# Patient Record
Sex: Female | Born: 1985 | Race: Black or African American | Hispanic: No | Marital: Single | Smoking: Never smoker
Health system: Southern US, Community
[De-identification: ages and names within clinical notes are randomized; demographics above are authoritative.]

## PROBLEM LIST (undated history)

## (undated) ENCOUNTER — Inpatient Hospital Stay: Payer: Self-pay

## (undated) DIAGNOSIS — A6000 Herpesviral infection of urogenital system, unspecified: Secondary | ICD-10-CM

## (undated) DIAGNOSIS — J45909 Unspecified asthma, uncomplicated: Secondary | ICD-10-CM

## (undated) DIAGNOSIS — D649 Anemia, unspecified: Secondary | ICD-10-CM

---
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
## 2006-01-23 ENCOUNTER — Emergency Department: Payer: Self-pay | Admitting: Emergency Medicine

## 2006-02-16 ENCOUNTER — Emergency Department: Payer: Self-pay | Admitting: Emergency Medicine

## 2007-06-24 ENCOUNTER — Emergency Department: Payer: Self-pay | Admitting: Emergency Medicine

## 2007-08-04 ENCOUNTER — Emergency Department: Payer: Self-pay | Admitting: Emergency Medicine

## 2007-10-03 ENCOUNTER — Ambulatory Visit: Payer: Self-pay | Admitting: Family Medicine

## 2007-12-25 ENCOUNTER — Encounter: Payer: Self-pay | Admitting: Maternal & Fetal Medicine

## 2008-01-26 ENCOUNTER — Observation Stay: Payer: Self-pay

## 2008-01-28 ENCOUNTER — Inpatient Hospital Stay: Payer: Self-pay | Admitting: Unknown Physician Specialty

## 2009-09-10 ENCOUNTER — Emergency Department: Payer: Self-pay | Admitting: Emergency Medicine

## 2009-10-30 ENCOUNTER — Emergency Department: Payer: Self-pay | Admitting: Emergency Medicine

## 2009-11-01 ENCOUNTER — Ambulatory Visit: Payer: Self-pay | Admitting: Unknown Physician Specialty

## 2009-11-05 LAB — PATHOLOGY REPORT

## 2011-03-18 ENCOUNTER — Emergency Department: Payer: Self-pay | Admitting: *Deleted

## 2011-03-18 LAB — COMPREHENSIVE METABOLIC PANEL
Albumin: 3.4 g/dL (ref 3.4–5.0)
Alkaline Phosphatase: 56 U/L (ref 50–136)
Anion Gap: 8 (ref 7–16)
BUN: 6 mg/dL — ABNORMAL LOW (ref 7–18)
Bilirubin,Total: 0.3 mg/dL (ref 0.2–1.0)
Calcium, Total: 8.6 mg/dL (ref 8.5–10.1)
Chloride: 103 mmol/L (ref 98–107)
Creatinine: 0.5 mg/dL — ABNORMAL LOW (ref 0.60–1.30)
EGFR (Non-African Amer.): >60
Osmolality: 268 (ref 275–301)
Potassium: 3.6 mmol/L (ref 3.5–5.1)
Sodium: 136 mmol/L (ref 136–145)
Total Protein: 8 g/dL (ref 6.4–8.2)

## 2011-03-18 LAB — CBC
HCT: 28.4 % — ABNORMAL LOW (ref 35.0–47.0)
HGB: 9.2 g/dL — ABNORMAL LOW (ref 12.0–16.0)
MCH: 25.1 pg — ABNORMAL LOW (ref 26.0–34.0)
MCHC: 32.5 g/dL (ref 32.0–36.0)
MCV: 77 fL — ABNORMAL LOW (ref 80–100)
RBC: 3.68 10*6/uL — ABNORMAL LOW (ref 3.80–5.20)
RDW: 18.6 % — ABNORMAL HIGH (ref 11.5–14.5)
WBC: 4.1 10*3/uL (ref 3.6–11.0)

## 2011-03-18 LAB — URINALYSIS, COMPLETE
Glucose,UR: NEGATIVE mg/dL (ref 0–75)
Ketone: NEGATIVE
Nitrite: NEGATIVE
Ph: 7 (ref 4.5–8.0)
Protein: 30
RBC,UR: 3 /HPF (ref 0–5)
Specific Gravity: 1.028 (ref 1.003–1.030)
WBC UR: 23 /HPF (ref 0–5)

## 2011-03-21 LAB — URINE CULTURE

## 2011-07-21 ENCOUNTER — Observation Stay: Payer: Self-pay | Admitting: Obstetrics and Gynecology

## 2011-07-21 LAB — URINALYSIS, COMPLETE
Glucose,UR: NEGATIVE mg/dL (ref 0–75)
Ketone: NEGATIVE
Ph: 8 (ref 4.5–8.0)
Squamous Epithelial: 2

## 2011-09-23 ENCOUNTER — Inpatient Hospital Stay: Payer: Self-pay | Admitting: Obstetrics and Gynecology

## 2011-09-23 ENCOUNTER — Observation Stay: Payer: Self-pay

## 2011-09-23 LAB — CBC WITH DIFFERENTIAL/PLATELET
Eosinophil #: 0 10*3/uL (ref 0.0–0.7)
Eosinophil %: 0.2 %
HCT: 23.9 % — ABNORMAL LOW (ref 35.0–47.0)
HGB: 7.2 g/dL — ABNORMAL LOW (ref 12.0–16.0)
Lymphocyte #: 1.3 10*3/uL (ref 1.0–3.6)
Lymphocyte %: 24.7 %
MCH: 21.9 pg — ABNORMAL LOW (ref 26.0–34.0)
MCV: 73 fL — ABNORMAL LOW (ref 80–100)
Monocyte %: 8 %
Neutrophil #: 3.4 10*3/uL (ref 1.4–6.5)
Platelet: 133 10*3/uL — ABNORMAL LOW (ref 150–440)
RBC: 3.29 10*6/uL — ABNORMAL LOW (ref 3.80–5.20)
WBC: 5.1 10*3/uL (ref 3.6–11.0)

## 2011-09-27 LAB — PATHOLOGY REPORT

## 2013-03-21 ENCOUNTER — Emergency Department: Payer: Self-pay | Admitting: Internal Medicine

## 2014-07-16 NOTE — H&P (Signed)
L&D Evaluation:  History Expanded:   HPI 29 yo [id number] with EDD of 10/04/11 presents with c/o abdominal pain after intercourse earlier this evening. Denies LOF or VB. PNC started and ACHD and tx to [facility]. Had pap with LGSIL, colp has not been done yet. H/o HSV.    Blood Type A positive    Group B Strep Results (Result >5wks must be treated as unknown) unknown/result > 5 weeks ago    Maternal HIV Negative    Maternal Syphilis Ab Nonreactive    Maternal Varicella Immune    Rubella Results immune    Maternal T-Dap Nonimmune    Patient's Medical History No Chronic Illness  SVD x 2, EAB x1, SAB x 1    Patient's Surgical History none    Medications Pre Natal Vitamins    Allergies NKDA    Social History none   ROS:   ROS see HPI   Exam:   Vital Signs stable    General no apparent distress    Mental Status clear    Chest clear    Heart no murmur/gallop/rubs    Abdomen gravid, mild contractions palpated    Fetal Position vertex    Edema no edema    Pelvic no external lesions    Mebranes Intact    FHT normal rate with no decels, appropriate for gestational age    Ucx mild, irregular   Impression:   Impression Preterm contractions after intercourse this evening   Plan:   Plan UA    Comments UA showed 3+ Leuk, 29 WBC, 2+ blood, trace bacteria, 2 epithelial. Given Rx for Macrobid, will order urine c&s. After resting and po hydration x approx 1 hour, pt reports pain has decreased. D/c home with instructions to abstain from intercourse x 2-3 weeks. Recommended to maintain hydration.    Follow Up Appointment already scheduled. 5/15   Electronic Signatures: Ander Purpura (CNM)  (Signed 15-May-13 03:40)  Authored: L&D Evaluation   Last Updated: 15-May-13 03:40 by Ander Purpura (CNM)

## 2014-07-16 NOTE — H&P (Signed)
L&D Evaluation:  History Expanded:   HPI 29 yo [id number] with EDD of 10/04/11 presents at 38 3/7 weeks with c/o contractions, dilated 6 cm per RN. She was evaluated for the same this am and d/c home dilated 4 cm. Denies LOF or VB. PNC started and ACHD and tx to [facility]. Had pap with LGSIL. H/o HSV - no outbreaks this pregnancy.    Blood Type (Maternal) A positive    Group B Strep Results Maternal (Result >5wks must be treated as unknown) negative    Maternal HIV Negative    Maternal Syphilis Ab Nonreactive    Maternal Varicella Immune    Rubella Results (Maternal) immune    Maternal T-Dap Nonimmune    Patient's Medical History No Chronic Illness  SVD x 2, EAB x1, SAB x 1    Patient's Surgical History none    Medications Pre Natal Vitamins    Allergies NKDA    Social History none   ROS:   ROS see HPI   Exam:   Vital Signs stable    General no apparent distress    Mental Status clear    Abdomen gravid, non-tender    Estimated Fetal Weight Average for gestational age    Fetal Position vertex    Edema no edema    Pelvic no external lesions, 7/80/-1, posterior    Mebranes Intact    FHT normal rate with no decels, Category 1    Ucx q 2-5 min   Impression:   Impression active labor   Plan:   Comments AROM performed with light meconium-stained amniotic fluid obtained. May have IV analgesia prn.  Declines epidural Anticipate vaginal delivery.   Electronic Signatures: Ander Purpura (CNM)  (Signed 18-Jul-13 21:52)  Authored: L&D Evaluation   Last Updated: 18-Jul-13 21:52 by Ander Purpura (CNM)

## 2014-07-16 NOTE — H&P (Signed)
L&D Evaluation:  History Expanded:   HPI 29 yo [id number] with EDD of 10/04/11 presents at 38 3/7 weeks with c/o contractions this am. Pt was dilated 4 cm yesterday in the office. Denies LOF or VB. PNC started and ACHD and tx to [facility]. Had pap with LGSIL. H/o HSV.    Blood Type (Maternal) A positive    Group B Strep Results Maternal (Result >5wks must be treated as unknown) negative  unknown/result > 5 weeks ago    Maternal HIV Negative    Maternal Syphilis Ab Nonreactive    Maternal Varicella Immune    Rubella Results (Maternal) immune    Maternal T-Dap Nonimmune    Patient's Medical History No Chronic Illness  SVD x 2, EAB x1, SAB x 1    Patient's Surgical History none    Medications Pre Natal Vitamins    Allergies NKDA    Social History none   ROS:   ROS see HPI   Exam:   Vital Signs stable    General no apparent distress    Mental Status clear    Abdomen gravid, non-tender    Estimated Fetal Weight Average for gestational age    Fetal Position vertex    Edema no edema    Pelvic no external lesions, 4-5/80/-2, posterior    Mebranes Intact    FHT normal rate with no decels, Category 1    Ucx q 8-10 min   Impression:   Impression early labor   Plan:   Plan discharge    Comments Labor precautions    Follow Up Appointment already scheduled. 7/24   Electronic Signatures: Burlene Arnt K (CNM)  (Signed 18-Jul-13 12:57)  Authored: L&D Evaluation   Last Updated: 18-Jul-13 12:57 by Ander Purpura (CNM)

## 2015-02-21 ENCOUNTER — Emergency Department
Admission: EM | Admit: 2015-02-21 | Discharge: 2015-02-21 | Disposition: A | Discharge status: Home / Self Care | Payer: Self-pay | Attending: Emergency Medicine | Admitting: Emergency Medicine

## 2015-02-21 ENCOUNTER — Encounter: Payer: Self-pay | Admitting: Emergency Medicine

## 2015-02-21 DIAGNOSIS — Z3A00 Weeks of gestation of pregnancy not specified: Secondary | ICD-10-CM | POA: Insufficient documentation

## 2015-02-21 DIAGNOSIS — Z3490 Encounter for supervision of normal pregnancy, unspecified, unspecified trimester: Secondary | ICD-10-CM

## 2015-02-21 DIAGNOSIS — O99019 Anemia complicating pregnancy, unspecified trimester: Secondary | ICD-10-CM | POA: Insufficient documentation

## 2015-02-21 DIAGNOSIS — E876 Hypokalemia: Secondary | ICD-10-CM

## 2015-02-21 DIAGNOSIS — R42 Dizziness and giddiness: Secondary | ICD-10-CM | POA: Insufficient documentation

## 2015-02-21 DIAGNOSIS — D649 Anemia, unspecified: Secondary | ICD-10-CM

## 2015-02-21 DIAGNOSIS — O99280 Endocrine, nutritional and metabolic diseases complicating pregnancy, unspecified trimester: Secondary | ICD-10-CM | POA: Insufficient documentation

## 2015-02-21 DIAGNOSIS — R51 Headache: Secondary | ICD-10-CM | POA: Insufficient documentation

## 2015-02-21 LAB — URINALYSIS COMPLETE WITH MICROSCOPIC (ARMC ONLY)
BILIRUBIN URINE: NEGATIVE
Glucose, UA: NEGATIVE mg/dL
HGB URINE DIPSTICK: NEGATIVE
Ketones, ur: NEGATIVE mg/dL
NITRITE: NEGATIVE
PH: 5 (ref 5.0–8.0)
Protein, ur: NEGATIVE mg/dL
SPECIFIC GRAVITY, URINE: 1.026 (ref 1.005–1.030)

## 2015-02-21 LAB — CBC WITH DIFFERENTIAL/PLATELET
Basophils Absolute: 0 10*3/uL (ref 0–0.1)
Basophils Relative: 1 %
Eosinophils Absolute: 0 10*3/uL (ref 0–0.7)
Eosinophils Relative: 1 %
HEMATOCRIT: 32.1 % — AB (ref 35.0–47.0)
HEMOGLOBIN: 10.7 g/dL — AB (ref 12.0–16.0)
LYMPHS PCT: 43 %
Lymphs Abs: 1.9 10*3/uL (ref 1.0–3.6)
MCH: 27.8 pg (ref 26.0–34.0)
MCHC: 33.2 g/dL (ref 32.0–36.0)
MCV: 83.8 fL (ref 80.0–100.0)
MONOS PCT: 6 %
Monocytes Absolute: 0.3 10*3/uL (ref 0.2–0.9)
NEUTROS ABS: 2.2 10*3/uL (ref 1.4–6.5)
NEUTROS PCT: 49 %
Platelets: 194 10*3/uL (ref 150–440)
RBC: 3.83 MIL/uL (ref 3.80–5.20)
RDW: 15.9 % — ABNORMAL HIGH (ref 11.5–14.5)
WBC: 4.4 10*3/uL (ref 3.6–11.0)

## 2015-02-21 LAB — BASIC METABOLIC PANEL
ANION GAP: 6 (ref 5–15)
BUN: 5 mg/dL — ABNORMAL LOW (ref 6–20)
CHLORIDE: 104 mmol/L (ref 101–111)
CO2: 26 mmol/L (ref 22–32)
Calcium: 8.7 mg/dL — ABNORMAL LOW (ref 8.9–10.3)
Creatinine, Ser: 0.58 mg/dL (ref 0.44–1.00)
GFR calc non Af Amer: >60 mL/min (ref >60)
Glucose, Bld: 94 mg/dL (ref 65–99)
Potassium: 2.9 mmol/L — CL (ref 3.5–5.1)
Sodium: 136 mmol/L (ref 135–145)

## 2015-02-21 LAB — POCT PREGNANCY, URINE: Preg Test, Ur: POSITIVE — AB

## 2015-02-21 MED ORDER — POTASSIUM CHLORIDE CRYS ER 20 MEQ PO TBCR
EXTENDED_RELEASE_TABLET | ORAL | Status: AC
  Filled 2015-02-21: qty 1

## 2015-02-21 MED ORDER — POTASSIUM CHLORIDE CRYS ER 20 MEQ PO TBCR
40.0000 meq | EXTENDED_RELEASE_TABLET | Freq: Once | ORAL | Status: DC
  Filled 2015-02-21: qty 2

## 2015-02-21 MED ORDER — ACETAMINOPHEN-CODEINE #3 300-30 MG PO TABS: 1.0000 | ORAL_TABLET | Freq: Once | ORAL | Status: DC

## 2015-02-21 MED ORDER — POTASSIUM CHLORIDE 20 MEQ PO PACK: 20.0000 meq | PACK | Freq: Once | ORAL | Status: DC

## 2015-02-21 MED ORDER — POTASSIUM CHLORIDE 20 MEQ PO PACK
40.0000 meq | PACK | Freq: Two times a day (BID) | ORAL | Status: DC
  Administered 2015-02-21: 40 meq via ORAL
  Filled 2015-02-21: qty 2

## 2015-02-21 NOTE — Discharge Instructions (Signed)
Anemia, Nonspecific Anemia is a condition in which the concentration of red blood cells or hemoglobin in the blood is below normal. Hemoglobin is a substance in red blood cells that carries oxygen to the tissues of the body. Anemia results in not enough oxygen reaching these tissues.  CAUSES  Common causes of anemia include:   Excessive bleeding. Bleeding may be internal or external. This includes excessive bleeding from periods (in women) or from the intestine.   Poor nutrition.   Chronic kidney, thyroid, and liver disease.  Bone marrow disorders that decrease red blood cell production.  Cancer and treatments for cancer.  HIV, AIDS, and their treatments.  Spleen problems that increase red blood cell destruction.  Blood disorders.  Excess destruction of red blood cells due to infection, medicines, and autoimmune disorders. SIGNS AND SYMPTOMS   Minor weakness.   Dizziness.   Headache.  Palpitations.   Shortness of breath, especially with exercise.   Paleness.  Cold sensitivity.  Indigestion.  Nausea.  Difficulty sleeping.  Difficulty concentrating. Symptoms may occur suddenly or they may develop slowly.  DIAGNOSIS  Additional blood tests are often needed. These help your health care provider determine the best treatment. Your health care provider will check your stool for blood and look for other causes of blood loss.  TREATMENT  Treatment varies depending on the cause of the anemia. Treatment can include:   Supplements of iron, vitamin 123456, or folic acid.   Hormone medicines.   A blood transfusion. This may be needed if blood loss is severe.   Hospitalization. This may be needed if there is significant continual blood loss.   Dietary changes.  Spleen removal. HOME CARE INSTRUCTIONS Keep all follow-up appointments. It often takes many weeks to correct anemia, and having your health care provider check on your condition and your response to  treatment is very important. SEEK IMMEDIATE MEDICAL CARE IF:   You develop extreme weakness, shortness of breath, or chest pain.   You become dizzy or have trouble concentrating.  You develop heavy vaginal bleeding.   You develop a rash.   You have bloody or black, tarry stools.   You faint.   You vomit up blood.   You vomit repeatedly.   You have abdominal pain.  You have a fever or persistent symptoms for more than 2-3 days.   You have a fever and your symptoms suddenly get worse.   You are dehydrated.  MAKE SURE YOU:  Understand these instructions.  Will watch your condition.  Will get help right away if you are not doing well or get worse.   This information is not intended to replace advice given to you by your health care provider. Make sure you discuss any questions you have with your health care provider.   Document Released: 04/01/2004 Document Revised: 10/25/2012 Document Reviewed: 08/18/2012 Elsevier Interactive Patient Education 2016 [place] will need to follow-up with [facility] clinic about your low potassium level. Also obtain iron tablets over-the-counter or prenatal vitamins with iron to begin taking once daily for your anemia. Take your potassium medication every day starting on Saturday Get an appointment with the OB/GYN of your choice or go to the Health Department Return to the emergency room if any urgent concerns are of sudden worsening of your symptoms.

## 2015-02-21 NOTE — ED Notes (Signed)
poc urine-positive.

## 2015-02-21 NOTE — ED Notes (Signed)
Pt here with c/o dizziness like the "room is spinning" and headache for almost a month now. Pt appears in no distress.

## 2015-02-21 NOTE — ED Notes (Signed)
States she could be pregnant.

## 2015-02-21 NOTE — ED Notes (Signed)
Has had symptoms for a month

## 2015-02-21 NOTE — ED Provider Notes (Signed)
-----------------------------------------   7:28 PM on 02/21/2015 -----------------------------------------  I, Nance Pear, attending physician, personally viewed and interpreted this EKG  EKG Time: 1811 Rate: 79 Rhythm: NSR Axis: normal Intervals: qtc 444 QRS: narrow ST changes: no st elevation Impression: normal ekg   Nance Pear, MD 02/21/15 1931

## 2015-02-21 NOTE — ED Provider Notes (Signed)
[facility] Emergency Department Provider Note  ____________________________________________  Time seen: Approximately 5:13 PM  I have reviewed the triage vital signs and the nursing notes.   HISTORY  Chief Complaint Headache and Dizziness   HPI Lindsey Cain is a 29 y.o. female with complaint of dizziness and headache for approximately 1 month. Patient states she has not seen her primary care doctor for this. She also states that there is possibility she could be pregnant. She denies any vaginal bleeding or vaginal pain. She has not experienced any abdominal discomfort at all. Patient denies any upper respiratory symptoms, fever or chills, she is not having nausea or vomiting. She denies any problems with her vision. She states she has had not had any problems walking or feeling faint. Patient states she does have a history of anemia.   History reviewed. No pertinent past medical history.  There are no active problems to display for this patient.   History reviewed. No pertinent past surgical history.  Current Outpatient Rx  Name  Route  Sig  Dispense  Refill  . potassium chloride (KLOR-CON) 20 MEQ packet   Oral   Take 20 mEq by mouth once.   7 packet   0     Allergies Review of patient's allergies indicates no known allergies.  No family history on file.  Social History Social History  Substance Use Topics  . Smoking status: Never Smoker   . Smokeless tobacco: None  . Alcohol Use: No    Review of Systems Constitutional: No fever/chills Eyes: No visual changes. ENT: No sore throat. Cardiovascular: Denies chest pain. Respiratory: Denies shortness of breath. Gastrointestinal: No abdominal pain.  No nausea, no vomiting.  No diarrhea.  Genitourinary: Negative for dysuria. No vaginal discharge. Musculoskeletal: Negative for back pain. Skin: Negative for rash. Neurological: Negative for headaches, positive dizziness  10-point ROS  otherwise negative.  ____________________________________________   PHYSICAL EXAM:  VITAL SIGNS: ED Triage Vitals  Enc Vitals Group     BP --      Pulse --      Resp --      Temp --      Temp src --      SpO2 --      Weight 02/21/15 1649 110 lb (49.896 kg)     Height 02/21/15 1649 5\' 7"  (1.702 m)     Head Cir --      Peak Flow --      Pain Score 02/21/15 1650 3     Pain Loc --      Pain Edu? --      Excl. in [place]? --     Constitutional: Alert and oriented. Well appearing and in no acute distress. Eyes: Conjunctivae are normal. PERRL. EOMI. no nystagmus is noted Head: Atraumatic. Nose: No congestion/rhinnorhea. Mouth/Throat: Mucous membranes are moist.  Oropharynx non-erythematous. Neck: No stridor.  Supple Hematological/Lymphatic/Immunilogical: No cervical lymphadenopathy. Cardiovascular: Normal rate, regular rhythm. Grossly normal heart sounds.  Good peripheral circulation. Respiratory: Normal respiratory effort.  No retractions. Lungs CTAB. Gastrointestinal: Soft and nontender. No distention.  No CVA tenderness. Musculoskeletal: There is all extremities without any difficulty. Patient was noted to have normal gait while in the emergency room. Neurologic:  Normal speech and language. No gross focal neurologic deficits are appreciated. No gait instability. Skin:  Skin is warm, dry and intact. No rash noted. Psychiatric: Mood and affect are normal. Speech and behavior are normal.  ____________________________________________   LABS (all labs ordered are  listed, but only abnormal results are displayed)  Labs Reviewed  BASIC METABOLIC PANEL - Abnormal; Notable for the following:    Potassium 2.9 (*)    BUN 5 (*)    Calcium 8.7 (*)    All other components within normal limits  CBC WITH DIFFERENTIAL/PLATELET - Abnormal; Notable for the following:    Hemoglobin 10.7 (*)    HCT 32.1 (*)    RDW 15.9 (*)    All other components within normal limits  URINALYSIS  COMPLETEWITH MICROSCOPIC ([facility] ONLY) - Abnormal; Notable for the following:    Color, Urine YELLOW (*)    APPearance CLEAR (*)    Leukocytes, UA TRACE (*)    Bacteria, UA RARE (*)    Squamous Epithelial / LPF 0-5 (*)    All other components within normal limits  POCT PREGNANCY, URINE - Abnormal; Notable for the following:    Preg Test, Ur POSITIVE (*)    All other components within normal limits  POC URINE PREG, ED   ____________________________________________  EKG  Per Dr. Archie Balboa ____________________________________________  RADIOLOGY  Not done ____________________________________________   PROCEDURES  Procedure(s) performed: None  Critical Care performed: No  ____________________________________________   INITIAL IMPRESSION / ASSESSMENT AND PLAN / ED COURSE  Pertinent labs & imaging results that were available during my care of the patient were reviewed by me and considered in my medical decision making (see chart for details).  Patient potassium was reported as 2.9 orally in the emergency room. Patient was able to drink potassium but unable to swallow potassium tablets. She also was anemic but has a history of same. Patient was discharged with a prescription for potassium packets along with instructions to obtain prenatal vitamins with iron. Patient states that she prefers to go to [place] side and was given instructions to do so. She is also to follow-up with [facility] clinic about her potassium. Patient was made aware that her pregnancy test is positive. She will call [facility] for an appointment. Patient is instructed to return to the emergency room if any severe worsening of her symptoms, abdominal pain, or vaginal bleeding. ____________________________________________   FINAL CLINICAL IMPRESSION(S) / ED DIAGNOSES  Final diagnoses:  Acute hypokalemia  Anemia, unspecified anemia type  Pregnancy      Johnn Hai, PA-C 02/21/15 2155  Nance Pear,  MD 02/21/15 [phone]

## 2015-03-09 NOTE — L&D Delivery Note (Signed)
Obstetrical Delivery Note   Date of Delivery:   10/08/2015 Primary OB:   [facility] Gestational Age/EDD: [redacted]w[redacted]d (Dated by LMP) Antepartum complications: anemia and late entry to care. 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 Apgars 8/9. Weight pending. Baby placed on mother's abdomen until cord stopped pulsating. The cord was then clamped and cut by mother. Baby placed skin to skin with mother. Anesthesia:    epidural Intrapartum complications: None GBS:    negative Laceration:    none Episiotomy:    none Placenta:    Via active 3rd stage. To pathology: no Estimated Blood Loss:  350  Baby:    Liveborn female, Apgars 8/9, weight pending.     Dalia Heading, CNM

## 2015-03-19 ENCOUNTER — Emergency Department
Admission: EM | Admit: 2015-03-19 | Discharge: 2015-03-19 | Disposition: A | Discharge status: Home / Self Care | Payer: Self-pay | Attending: Emergency Medicine | Admitting: Emergency Medicine

## 2015-03-19 ENCOUNTER — Emergency Department: Payer: Self-pay

## 2015-03-19 DIAGNOSIS — Z79899 Other long term (current) drug therapy: Secondary | ICD-10-CM | POA: Insufficient documentation

## 2015-03-19 DIAGNOSIS — Z3A09 9 weeks gestation of pregnancy: Secondary | ICD-10-CM | POA: Insufficient documentation

## 2015-03-19 DIAGNOSIS — O200 Threatened abortion: Secondary | ICD-10-CM | POA: Insufficient documentation

## 2015-03-19 LAB — CBC
HCT: 35.3 % (ref 35.0–47.0)
HEMOGLOBIN: 11.6 g/dL — AB (ref 12.0–16.0)
MCH: 27.4 pg (ref 26.0–34.0)
MCHC: 32.9 g/dL (ref 32.0–36.0)
MCV: 83.4 fL (ref 80.0–100.0)
Platelets: 213 10*3/uL (ref 150–440)
RBC: 4.23 MIL/uL (ref 3.80–5.20)
RDW: 15.7 % — ABNORMAL HIGH (ref 11.5–14.5)
WBC: 4.1 10*3/uL (ref 3.6–11.0)

## 2015-03-19 LAB — ABO/RH: ABO/RH(D): A POS

## 2015-03-19 LAB — HCG, QUANTITATIVE, PREGNANCY: HCG, BETA CHAIN, QUANT, S: 240988 m[IU]/mL — AB (ref <5)

## 2015-03-19 NOTE — ED Notes (Signed)
Patient transported to Ultrasound 

## 2015-03-19 NOTE — Discharge Instructions (Signed)
Please call the number provided for the health department, or an OB/GYN to arrange a follow-up appointment in 2 days. Recheck of your labs. Today's beta hCG (pregnancy hormone level) is 240,000. Return to the emergency department for any lower abdominal pain, increased bleeding, or any other symptom personally concerning to yourself.    Threatened Miscarriage A threatened miscarriage occurs when you have vaginal bleeding during your first 20 weeks of pregnancy but the pregnancy has not ended. If you have vaginal bleeding during this time, your health care provider will do tests to make sure you are still pregnant. If the tests show you are still pregnant and the developing baby (fetus) inside your womb (uterus) is still growing, your condition is considered a threatened miscarriage. A threatened miscarriage does not mean your pregnancy will end, but it does increase the risk of losing your pregnancy (complete miscarriage). CAUSES  The cause of a threatened miscarriage is usually not known. If you go on to have a complete miscarriage, the most common cause is an abnormal number of chromosomes in the developing baby. Chromosomes are the structures inside cells that hold all your genetic material. Some causes of vaginal bleeding that do not result in miscarriage include:  Having sex.  Having an infection.  Normal hormone changes of pregnancy.  Bleeding that occurs when an egg implants in your uterus. RISK FACTORS Risk factors for bleeding in early pregnancy include:  Obesity.  Smoking.  Drinking excessive amounts of alcohol or caffeine.  Recreational drug use. SIGNS AND SYMPTOMS  Light vaginal bleeding.  Mild abdominal pain or cramps. DIAGNOSIS  If you have bleeding with or without abdominal pain before 20 weeks of pregnancy, your health care provider will do tests to check whether you are still pregnant. One important test involves using sound waves and a computer (ultrasound) to  create images of the inside of your uterus. Other tests include an internal exam of your vagina and uterus (pelvic exam) and measurement of your baby's heart rate.  You may be diagnosed with a threatened miscarriage if:  Ultrasound testing shows you are still pregnant.  Your baby's heart rate is strong.  A pelvic exam shows that the opening between your uterus and your vagina (cervix) is closed.  Your heart rate and blood pressure are stable.  Blood tests confirm you are still pregnant. TREATMENT  No treatments have been shown to prevent a threatened miscarriage from going on to a complete miscarriage. However, the right home care is important.  HOME CARE INSTRUCTIONS   Make sure you keep all your appointments for prenatal care. This is very important.  Get plenty of rest.  Do not have sex or use tampons if you have vaginal bleeding.  Do not douche.  Do not smoke or use recreational drugs.  Do not drink alcohol.  Avoid caffeine. SEEK MEDICAL CARE IF:  You have light vaginal bleeding or spotting while pregnant.  You have abdominal pain or cramping.  You have a fever. SEEK IMMEDIATE MEDICAL CARE IF:  You have heavy vaginal bleeding.  You have blood clots coming from your vagina.  You have severe low back pain or abdominal cramps.  You have fever, chills, and severe abdominal pain. MAKE SURE YOU:  Understand these instructions.  Will watch your condition.  Will get help right away if you are not doing well or get worse.   This information is not intended to replace advice given to you by your health care provider. Make sure you discuss  any questions you have with your health care provider.   Document Released: 02/22/2005 Document Revised: 02/27/2013 Document Reviewed: 12/19/2012 Elsevier Interactive Patient Education [employer].

## 2015-03-19 NOTE — ED Provider Notes (Signed)
[facility] Emergency Department Provider Note  Time seen: 8:17 AM  I have reviewed the triage vital signs and the nursing notes.   HISTORY  Chief Complaint Vaginal Bleeding    HPI Lindsey Cain is a 30 y.o. female G5 P3 A1 who presents to the emergency department with lower abdominal cramping and vaginal bleeding. According to the patient last night she began experiencing lower abdominal cramping she states was mild. States the cramping is largely resolved at this morning while wiping she noticed a small amount of blood on the toilet paper. Patient denies any dysuria. States her abdominal pain is mild, and improved from what it was yesterday. Patient states her last menstrual period Was 01/16/15. Approximately [redacted] weeks pregnant this point.    No past medical history on file.  There are no active problems to display for this patient.   No past surgical history on file.  Current Outpatient Rx  Name  Route  Sig  Dispense  Refill  . potassium chloride (KLOR-CON) 20 MEQ packet   Oral   Take 20 mEq by mouth once.   7 packet   0     Allergies Review of patient's allergies indicates no known allergies.  No family history on file.  Social History Social History  Substance Use Topics  . Smoking status: Never Smoker   . Smokeless tobacco: Not on file  . Alcohol Use: No    Review of Systems Constitutional: Negative for fever Cardiovascular: Negative for chest pain. Respiratory: Negative for shortness of breath. Gastrointestinal: Mild lower abdominal cramping. Genitourinary: Vaginal spotting. Musculoskeletal: Negative for back pain 10-point ROS otherwise negative.  ____________________________________________   PHYSICAL EXAM:  VITAL SIGNS: ED Triage Vitals  Enc Vitals Group     BP 03/19/15 0811 119/83 mmHg     Pulse Rate 03/19/15 0811 90     Resp 03/19/15 0811 16     Temp 03/19/15 0811 98.2 F (36.8 C)     Temp Source 03/19/15 0811  Oral     SpO2 03/19/15 0811 100 %     Weight 03/19/15 0811 115 lb (52.164 kg)     Height 03/19/15 0811 5\' 7"  (1.702 m)     Head Cir --      Peak Flow --      Pain Score 03/19/15 0804 2     Pain Loc --      Pain Edu? --      Excl. in [place]? --     Constitutional: Alert and oriented. Well appearing and in no distress. Eyes: Normal exam ENT   Head: Normocephalic and atraumatic.   Mouth/Throat: Mucous membranes are moist. Cardiovascular: Normal rate, regular rhythm. No murmur Respiratory: Normal respiratory effort without tachypnea nor retractions. Breath sounds are clear and equal bilaterally. No wheezes/rales/rhonchi. Gastrointestinal: Soft, mild suprapubic tenderness palpation. No rebound or guarding. No distention. Musculoskeletal: Nontender with normal range of motion in all extremities.  Neurologic:  Normal speech and language. No gross focal neurologic deficits  Skin:  Skin is warm, dry and intact.  Psychiatric: Mood and affect are normal. Speech and behavior are normal  ____________________________________________     RADIOLOGY  Ultrasound consistent with single IUP 9 weeks 2 days.  ____________________________________________    INITIAL IMPRESSION / ASSESSMENT AND PLAN / ED COURSE  Pertinent labs & imaging results that were available during my care of the patient were reviewed by me and considered in my medical decision making (see chart for details).  Patient presents  the emergency department with lower abdominal cramping last night, vaginal spotting today approximately [redacted] weeks pregnant. We will check labs, and obtain an ultrasound to further evaluate. The patient has not yet called an OB/GYN for follow-up.  Ultrasound consistent with IUP at 9 weeks with a normal heart rate. Labs are within normal limits. Positive blood type. We'll discharge home with OB/GYN follow-up. Patient agreeable to plan  ____________________________________________   FINAL CLINICAL  IMPRESSION(S) / ED DIAGNOSES  Threatened abortion   Harvest Dark, MD 03/19/15 1123

## 2015-03-19 NOTE — ED Notes (Signed)
Pt states had abdominal cramping last night and this morning. Pt states this morning she noticed some vaginal spotting. Pt states LMP was 01/16/15.

## 2015-03-19 NOTE — ED Notes (Signed)
Pt reports is about [redacted] weeks pregnant and last pm started with vaginal bleeding and cramping. Has not had an OB visit yet.

## 2015-04-24 DIAGNOSIS — Z8742 Personal history of other diseases of the female genital tract: Secondary | ICD-10-CM | POA: Insufficient documentation

## 2015-04-25 LAB — OB RESULTS CONSOLE VARICELLA ZOSTER ANTIBODY, IGG: VARICELLA IGG: IMMUNE

## 2015-04-25 LAB — OB RESULTS CONSOLE RPR: RPR: NONREACTIVE

## 2015-04-25 LAB — OB RESULTS CONSOLE HEPATITIS B SURFACE ANTIGEN: Hepatitis B Surface Ag: NEGATIVE

## 2015-04-25 LAB — OB RESULTS CONSOLE RUBELLA ANTIBODY, IGM: RUBELLA: IMMUNE

## 2015-05-22 ENCOUNTER — Other Ambulatory Visit: Payer: Self-pay | Admitting: Physician Assistant

## 2015-05-22 DIAGNOSIS — R1310 Dysphagia, unspecified: Secondary | ICD-10-CM | POA: Insufficient documentation

## 2015-05-22 DIAGNOSIS — Z3482 Encounter for supervision of other normal pregnancy, second trimester: Secondary | ICD-10-CM

## 2015-05-27 ENCOUNTER — Emergency Department
Admission: EM | Admit: 2015-05-27 | Discharge: 2015-05-27 | Disposition: A | Discharge status: Home / Self Care | Payer: Self-pay | Attending: Emergency Medicine | Admitting: Emergency Medicine

## 2015-05-27 ENCOUNTER — Encounter: Payer: Self-pay | Admitting: Emergency Medicine

## 2015-05-27 DIAGNOSIS — O26892 Other specified pregnancy related conditions, second trimester: Secondary | ICD-10-CM | POA: Insufficient documentation

## 2015-05-27 DIAGNOSIS — K529 Noninfective gastroenteritis and colitis, unspecified: Secondary | ICD-10-CM | POA: Insufficient documentation

## 2015-05-27 DIAGNOSIS — Z3492 Encounter for supervision of normal pregnancy, unspecified, second trimester: Secondary | ICD-10-CM

## 2015-05-27 DIAGNOSIS — R1030 Lower abdominal pain, unspecified: Secondary | ICD-10-CM

## 2015-05-27 LAB — CBC
HEMATOCRIT: 31 % — AB (ref 35.0–47.0)
Hemoglobin: 10.5 g/dL — ABNORMAL LOW (ref 12.0–16.0)
MCH: 28.4 pg (ref 26.0–34.0)
MCHC: 33.9 g/dL (ref 32.0–36.0)
MCV: 83.7 fL (ref 80.0–100.0)
PLATELETS: 175 10*3/uL (ref 150–440)
RBC: 3.7 MIL/uL — ABNORMAL LOW (ref 3.80–5.20)
RDW: 14.8 % — AB (ref 11.5–14.5)
WBC: 4.6 10*3/uL (ref 3.6–11.0)

## 2015-05-27 LAB — URINALYSIS COMPLETE WITH MICROSCOPIC (ARMC ONLY)
BACTERIA UA: NONE SEEN
Bilirubin Urine: NEGATIVE
GLUCOSE, UA: NEGATIVE mg/dL
Hgb urine dipstick: NEGATIVE
Ketones, ur: NEGATIVE mg/dL
Leukocytes, UA: NEGATIVE
Nitrite: NEGATIVE
Protein, ur: NEGATIVE mg/dL
SPECIFIC GRAVITY, URINE: 1.026 (ref 1.005–1.030)
pH: 5 (ref 5.0–8.0)

## 2015-05-27 LAB — COMPREHENSIVE METABOLIC PANEL
ALBUMIN: 3 g/dL — AB (ref 3.5–5.0)
ALT: 10 U/L — AB (ref 14–54)
AST: 24 U/L (ref 15–41)
Alkaline Phosphatase: 53 U/L (ref 38–126)
Anion gap: 7 (ref 5–15)
BILIRUBIN TOTAL: 0.9 mg/dL (ref 0.3–1.2)
BUN: 8 mg/dL (ref 6–20)
CO2: 23 mmol/L (ref 22–32)
CREATININE: 0.47 mg/dL (ref 0.44–1.00)
Calcium: 8.5 mg/dL — ABNORMAL LOW (ref 8.9–10.3)
Chloride: 102 mmol/L (ref 101–111)
GFR calc Af Amer: >60 mL/min (ref >60)
GFR calc non Af Amer: >60 mL/min (ref >60)
Glucose, Bld: 98 mg/dL (ref 65–99)
POTASSIUM: 3.6 mmol/L (ref 3.5–5.1)
Sodium: 132 mmol/L — ABNORMAL LOW (ref 135–145)
TOTAL PROTEIN: 7.3 g/dL (ref 6.5–8.1)

## 2015-05-27 LAB — HCG, QUANTITATIVE, PREGNANCY: HCG, BETA CHAIN, QUANT, S: 47473 m[IU]/mL — AB (ref <5)

## 2015-05-27 LAB — LIPASE, BLOOD: Lipase: 18 U/L (ref 11–51)

## 2015-05-27 MED ORDER — ACETAMINOPHEN 325 MG PO TABS
650.0000 mg | ORAL_TABLET | Freq: Once | ORAL | Status: AC
  Administered 2015-05-27: 650 mg via ORAL
  Filled 2015-05-27: qty 2

## 2015-05-27 MED ORDER — METOCLOPRAMIDE HCL 10 MG PO TABS: 10.0000 mg | ORAL_TABLET | Freq: Four times a day (QID) | ORAL | Status: DC | PRN

## 2015-05-27 MED ORDER — PRENATAL VITAMINS 0.8 MG PO TABS: 1.0000 | ORAL_TABLET | Freq: Every day | ORAL | Status: DC

## 2015-05-27 NOTE — ED Notes (Addendum)
Pt to ed with c/o abd pain, vomiting and diarrhea that started last night.  [redacted] weeks pregnant, G4P3

## 2015-05-27 NOTE — ED Provider Notes (Signed)
[facility] Emergency Department Provider Note  ____________________________________________  Time seen: 11:50 PM  I have reviewed the triage vital signs and the nursing notes.   HISTORY  Chief Complaint Abdominal Pain    HPI Lindsey Cain is a 30 y.o. female who complains of lower abdominal pain with vomiting and diarrhea that started last night. She is [redacted] weeks pregnant accompanied pregnancy, following up with the health department. No medical history or previous surgeries. No hematemesis, no bloody stool. No pelvic cramping or vaginal bleeding or leakage of fluid. Normal fetal movements. No dizziness or syncope   Pain is moderate intensity, nonradiating, no aggravating or alleviating factors. Constant with waxing and waning course. Achy.  History reviewed. No pertinent past medical history.   There are no active problems to display for this patient.    History reviewed. No pertinent past surgical history.   Current Outpatient Rx  Name  Route  Sig  Dispense  Refill  . metoCLOPramide (REGLAN) 10 MG tablet   Oral   Take 1 tablet (10 mg total) by mouth every 6 (six) hours as needed for nausea.   60 tablet   0   . potassium chloride (KLOR-CON) 20 MEQ packet   Oral   Take 20 mEq by mouth once.   7 packet   0   . Prenatal Multivit-Min-Fe-FA (PRENATAL VITAMINS) 0.8 MG tablet   Oral   Take 1 tablet by mouth daily.   30 tablet   3      Allergies Review of patient's allergies indicates no known allergies.   History reviewed. No pertinent family history.  Social History Social History  Substance Use Topics  . Smoking status: Never Smoker   . Smokeless tobacco: None  . Alcohol Use: No    Review of Systems  Constitutional:   No fever or chills. No weight changes Eyes:   No blurry vision or double vision.  ENT:   No sore throat.  Cardiovascular:   No chest pain. Respiratory:   No dyspnea or cough. Gastrointestinal:   Diffuse  lower abdominal pain with vomiting and diarrhea.  No BRBPR or melena. Genitourinary:   Negative for dysuria or difficulty urinating. Musculoskeletal:   Negative for back pain. No joint swelling or pain. Skin:   Negative for rash. Neurological:   Negative for headaches, focal weakness or numbness. Psychiatric:  No anxiety or depression.   Endocrine:  No changes in energy or sleep difficulty.  10-point ROS otherwise negative.  ____________________________________________   PHYSICAL EXAM:  VITAL SIGNS: ED Triage Vitals  Enc Vitals Group     BP 05/27/15 0847 109/68 mmHg     Pulse Rate 05/27/15 0847 99     Resp 05/27/15 0847 16     Temp 05/27/15 0847 98.1 F (36.7 C)     Temp Source 05/27/15 0847 Oral     SpO2 05/27/15 0847 100 %     Weight 05/27/15 0847 122 lb (55.339 kg)     Height 05/27/15 0847 5\' 6"  (1.676 m)     Head Cir --      Peak Flow --      Pain Score 05/27/15 0844 10     Pain Loc --      Pain Edu? --      Excl. in [place]? --     Vital signs reviewed, nursing assessments reviewed.   Constitutional:   Alert and oriented. Well appearing and in no distress. Eyes:   No scleral icterus. No  conjunctival pallor. PERRL. EOMI ENT   Head:   Normocephalic and atraumatic.   Nose:   No congestion/rhinnorhea. No septal hematoma   Mouth/Throat:   MMM, no pharyngeal erythema. No peritonsillar mass.    Neck:   No stridor. No SubQ emphysema. No meningismus. Hematological/Lymphatic/Immunilogical:   No cervical lymphadenopathy. Cardiovascular:   RRR. Symmetric bilateral radial and DP pulses.  No murmurs.  Respiratory:   Normal respiratory effort without tachypnea nor retractions. Breath sounds are clear and equal bilaterally. No wheezes/rales/rhonchi. Gastrointestinal:   Soft With mild suprapubic and left lower quadrant tenderness. Non distended. There is no CVA tenderness.  No rebound, rigidity, or guarding. Genitourinary:   deferred Musculoskeletal:   Nontender with  normal range of motion in all extremities. No joint effusions.  No lower extremity tenderness.  No edema. Neurologic:   Normal speech and language.  CN 2-10 normal. Motor grossly intact. No gross focal neurologic deficits are appreciated.  Skin:    Skin is warm, dry and intact. No rash noted.  No petechiae, purpura, or bullae. Psychiatric:   Mood and affect are normal. ____________________________________________    LABS (pertinent positives/negatives) (all labs ordered are listed, but only abnormal results are displayed) Labs Reviewed  COMPREHENSIVE METABOLIC PANEL - Abnormal; Notable for the following:    Sodium 132 (*)    Calcium 8.5 (*)    Albumin 3.0 (*)    ALT 10 (*)    All other components within normal limits  CBC - Abnormal; Notable for the following:    RBC 3.70 (*)    Hemoglobin 10.5 (*)    HCT 31.0 (*)    RDW 14.8 (*)    All other components within normal limits  URINALYSIS COMPLETEWITH MICROSCOPIC ([facility] ONLY) - Abnormal; Notable for the following:    Color, Urine YELLOW (*)    APPearance CLEAR (*)    Squamous Epithelial / LPF 0-5 (*)    All other components within normal limits  HCG, QUANTITATIVE, PREGNANCY - Abnormal; Notable for the following:    hCG, Beta Chain, Quant, S [id number] (*)    All other components within normal limits  LIPASE, BLOOD   ____________________________________________   EKG    ____________________________________________    RADIOLOGY    ____________________________________________   PROCEDURES   ____________________________________________   INITIAL IMPRESSION / ASSESSMENT AND PLAN / ED COURSE  Pertinent labs & imaging results that were available during my care of the patient were reviewed by me and considered in my medical decision making (see chart for details).  Patient presents with lower abdominal pain with vomiting and diarrhea, very likely be gastroneuritis. Urinalysis negative for any signs of infection. Low  suspicion for appendicitis cholecystitis or pancreatitis. No evidence of perforation or obstruction. Pregnancy complication at this time, low suspicion for PID TOA or STI. Low suspicion for ectopic. We'll discharge home to follow up with health department. Reglan as needed for nausea.     ____________________________________________   FINAL CLINICAL IMPRESSION(S) / ED DIAGNOSES  Final diagnoses:  Lower abdominal pain  Second trimester pregnancy  Acute gastroenteritis    Carrie Mew, MD 05/27/15 1425

## 2015-05-27 NOTE — Discharge Instructions (Signed)

## 2015-05-28 ENCOUNTER — Ambulatory Visit
Admission: RE | Admit: 2015-05-28 | Discharge: 2015-05-28 | Disposition: A | Discharge status: Home / Self Care | Payer: Self-pay | Source: Ambulatory Visit | Attending: Physician Assistant | Admitting: Physician Assistant

## 2015-05-28 DIAGNOSIS — Z3A19 19 weeks gestation of pregnancy: Secondary | ICD-10-CM | POA: Insufficient documentation

## 2015-05-28 DIAGNOSIS — Z3492 Encounter for supervision of normal pregnancy, unspecified, second trimester: Secondary | ICD-10-CM | POA: Insufficient documentation

## 2015-05-28 DIAGNOSIS — Z3482 Encounter for supervision of other normal pregnancy, second trimester: Secondary | ICD-10-CM

## 2015-07-21 ENCOUNTER — Encounter: Payer: Self-pay | Admitting: *Deleted

## 2015-07-21 ENCOUNTER — Inpatient Hospital Stay
Admission: EM | Admit: 2015-07-21 | Discharge: 2015-07-21 | Disposition: A | Discharge status: Home / Self Care | Payer: Medicaid Other | Attending: Certified Nurse Midwife | Admitting: Certified Nurse Midwife

## 2015-07-21 DIAGNOSIS — O26892 Other specified pregnancy related conditions, second trimester: Secondary | ICD-10-CM | POA: Insufficient documentation

## 2015-07-21 DIAGNOSIS — R0602 Shortness of breath: Secondary | ICD-10-CM | POA: Diagnosis present

## 2015-07-21 DIAGNOSIS — Z3A26 26 weeks gestation of pregnancy: Secondary | ICD-10-CM | POA: Insufficient documentation

## 2015-07-21 DIAGNOSIS — M545 Low back pain: Secondary | ICD-10-CM | POA: Diagnosis present

## 2015-07-21 HISTORY — DX: Anemia, unspecified: D64.9

## 2015-07-21 LAB — URINALYSIS COMPLETE WITH MICROSCOPIC (ARMC ONLY)
Glucose, UA: NEGATIVE mg/dL
HGB URINE DIPSTICK: NEGATIVE
KETONES UR: NEGATIVE mg/dL
Nitrite: NEGATIVE
PROTEIN: 30 mg/dL — AB
SPECIFIC GRAVITY, URINE: 1.029 (ref 1.005–1.030)
pH: 5 (ref 5.0–8.0)

## 2015-07-21 NOTE — Discharge Summary (Signed)
Pt d/c'd home. D/c instructions reviewed. Pt verbalized understanding. Will follow up with provider in [facility].

## 2015-07-21 NOTE — Discharge Instructions (Signed)
For your back pain, take Tylenol as needed. Use heat, warm showers, and massage. If you are able, see a Chiropractor.   Continue to take your prenatal vitamins.   Eat foods high in iron, some foods include:   Red Meat Leafy greens Fortified cereals (such as Cheerios, Special K) Molasses   Follow activity restrictions your provider has instructed.

## 2015-07-21 NOTE — Final Progress Note (Addendum)
Physician Final Progress Note  Patient ID: Lindsey Cain MRN: [id number] DOB/AGE: 06-Mar-1986 30 y.o.  Admit date: 07/21/2015 Admitting provider: Aletha Halim, MD Discharge date: 07/21/2015   Admission Diagnoses: Lower back pain and shortness of breath IUP at 26wk4d gestation  Discharge Diagnoses: IUP at 26.4 weeks Possible right sciatic pain Anemia Shortness of breath  Significant Findings:  30 year old G6 P40 AAF with EDC=10/23/2015 by LMP 01/16/2015 and c/w a 9wk2day ultrasound who presents from work with c/o SOB and pain in right lower back/right leg. Patient works on a [employer] and stands all day at work. Has had the pain intermittently this pregnancy that begins in the right sacral area and extends down the buttock and posterior right thigh. The pain recurred this AM at work. Denies recent trauma or any  pain preceding this pregnancy. The pain is worse with movement. Has not tried Tylenol, heat, or topical analgesia. Rest improves pain. Baby active. Prenatal care at [facility] also remarkable for anemia, hx of HSV II, pica, and low BMI. Has a hx of asthma as a child. Denies nasal or sinus congestion. 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 Reports that her last hmg was 8+gm/dl. She was advised to increase her iron intake...she was vomiting over the weekend which she thought was from the ferrous sulfate. [facility] talking about sending her to hematology...but they are waiting for her Medicaid (self pay currently).  Exam: BP 103/66 mmHg  Pulse 99  Temp(Src) 98.3 F (36.8 C) (Oral)  Resp 20  LMP 01/16/2015  General : in NAD, but obviously in pain when turning to her right side Lungs: CTA, no wheezes or rhonchi Heart: RRR without murmur FHR: 140s with accelerations to 160s, moderate variability Contractions: occasional BH contraction palpated Ultrasound: cephalic, good FM, fundal placenta  Results for orders placed or performed during the hospital encounter of  07/21/15 (from the past 24 hour(s))  Urinalysis complete, with microscopic ([facility] only)     Status: Abnormal   Collection Time: 07/21/15 10:51 AM  Result Value Ref Range   Color, Urine AMBER (A) YELLOW   APPearance HAZY (A) CLEAR   Glucose, UA NEGATIVE NEGATIVE mg/dL   Bilirubin Urine 1+ (A) NEGATIVE   Ketones, ur NEGATIVE NEGATIVE mg/dL   Specific Gravity, Urine 1.029 1.005 - 1.030   Hgb urine dipstick NEGATIVE NEGATIVE   pH 5.0 5.0 - 8.0   Protein, ur 30 (A) NEGATIVE mg/dL   Nitrite NEGATIVE NEGATIVE   Leukocytes, UA TRACE (A) NEGATIVE   RBC / HPF 0-5 0 - 5 RBC/hpf   WBC, UA 0-5 0 - 5 WBC/hpf   Bacteria, UA RARE (A) NONE SEEN   Squamous Epithelial / LPF 0-5 (A) NONE SEEN   Mucous PRESENT    Hyaline Casts, UA PRESENT   Back: no SI joint tenderness, no CVAT, no bruising  A: IUP at 26.4 weeks with possible sciatic type pain Dehydration from inadequate fluid intake and vomiting over the weekend with iron supplements SOB: resolved-but may be R/T anemia  P: 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 Avoid prolonged  sitting and standing     Medication List    ASK your doctor about these medications        ferrous sulfate 325 (65 FE) MG tablet  Take 325 mg by mouth 3 (three) times daily with meals.     Prenatal Vitamins 0.8 MG tablet  Take 1 tablet by mouth daily.         Total time spent taking care of this patient: 20 minutes  Signed: [place] Folden 07/21/2015, 11:57 AM

## 2015-07-31 LAB — HM HIV SCREENING LAB: HM HIV Screening: NEGATIVE

## 2015-08-18 ENCOUNTER — Inpatient Hospital Stay: Payer: Medicaid Other | Attending: Oncology | Admitting: Oncology

## 2015-08-18 ENCOUNTER — Ambulatory Visit: Payer: Medicaid Other

## 2015-08-18 VITALS — BP 102/71 | HR 86 | Temp 97.8°F | Resp 18 | Wt 133.9 lb

## 2015-08-18 DIAGNOSIS — Z79899 Other long term (current) drug therapy: Secondary | ICD-10-CM | POA: Insufficient documentation

## 2015-08-18 DIAGNOSIS — O99013 Anemia complicating pregnancy, third trimester: Secondary | ICD-10-CM | POA: Diagnosis not present

## 2015-08-18 DIAGNOSIS — D509 Iron deficiency anemia, unspecified: Secondary | ICD-10-CM

## 2015-08-18 LAB — CBC WITH DIFFERENTIAL/PLATELET
BASOS PCT: 0 %
Basophils Absolute: 0 10*3/uL (ref 0–0.1)
EOS ABS: 0 10*3/uL (ref 0–0.7)
EOS PCT: 1 %
HCT: 30.8 % — ABNORMAL LOW (ref 35.0–47.0)
Hemoglobin: 10.1 g/dL — ABNORMAL LOW (ref 12.0–16.0)
LYMPHS ABS: 1.3 10*3/uL (ref 1.0–3.6)
Lymphocytes Relative: 26 %
MCH: 27.5 pg (ref 26.0–34.0)
MCHC: 32.8 g/dL (ref 32.0–36.0)
MCV: 83.6 fL (ref 80.0–100.0)
MONOS PCT: 8 %
Monocytes Absolute: 0.4 10*3/uL (ref 0.2–0.9)
Neutro Abs: 3.1 10*3/uL (ref 1.4–6.5)
Neutrophils Relative %: 65 %
PLATELETS: 187 10*3/uL (ref 150–440)
RBC: 3.68 MIL/uL — AB (ref 3.80–5.20)
RDW: 16.7 % — ABNORMAL HIGH (ref 11.5–14.5)
WBC: 4.8 10*3/uL (ref 3.6–11.0)

## 2015-08-18 LAB — IRON AND TIBC
IRON: 41 ug/dL (ref 28–170)
SATURATION RATIOS: 8 % — AB (ref 10.4–31.8)
TIBC: 513 ug/dL — AB (ref 250–450)
UIBC: 472 ug/dL

## 2015-08-18 LAB — FERRITIN: Ferritin: 13 ng/mL (ref 11–307)

## 2015-08-22 ENCOUNTER — Inpatient Hospital Stay: Payer: Medicaid Other

## 2015-08-22 VITALS — BP 102/67 | HR 90 | Temp 97.6°F | Resp 18

## 2015-08-22 DIAGNOSIS — O99013 Anemia complicating pregnancy, third trimester: Secondary | ICD-10-CM | POA: Diagnosis not present

## 2015-08-22 DIAGNOSIS — D509 Iron deficiency anemia, unspecified: Secondary | ICD-10-CM

## 2015-08-22 MED ORDER — SODIUM CHLORIDE 0.9 % IV SOLN
510.0000 mg | Freq: Once | INTRAVENOUS | Status: AC
  Administered 2015-08-22: 510 mg via INTRAVENOUS
  Filled 2015-08-22: qty 17

## 2015-08-22 MED ORDER — SODIUM CHLORIDE 0.9 % IV SOLN
INTRAVENOUS | Status: DC
  Administered 2015-08-22: 15:00:00 via INTRAVENOUS
  Filled 2015-08-22: qty 1000

## 2015-08-24 NOTE — Progress Notes (Signed)
[place]  Telephone:([phone]) [phone] [phone]) [phone]  ID: Twana First OB: 1985/03/19  MR#: [id number]  [id number]  Patient Care Team: No Pcp Per Patient as PCP - General (General Practice)  CHIEF COMPLAINT:  Chief Complaint  Patient presents with  . New Patient (Initial Visit)    anemia    INTERVAL HISTORY: Patient is a 30 year old female who was found to have a declining hemoglobin and the third trimester pregnancy and routine blood work. She currently feels well and is asymptomatic. She is gaining weight appropriately. She has no neurologic complaints. She denies any recent fevers. She does not complain of weakness or fatigue. She has no chest pain or shortness of breath. She denies any nausea, vomiting, constipation, or diarrhea. She has no melena or hematochezia. She has no urinary complaints. Patient offers no specific complaints today.   REVIEW OF SYSTEMS:   Review of Systems  Constitutional: Negative.  Negative for fever, weight loss and malaise/fatigue.  Respiratory: Negative.  Negative for cough and shortness of breath.   Cardiovascular: Negative.  Negative for chest pain.  Gastrointestinal: Negative.  Negative for blood in stool and melena.  Musculoskeletal: Negative.   Neurological: Negative.  Negative for weakness.  Psychiatric/Behavioral: Negative.     As per HPI. Otherwise, a complete review of systems is negatve.  PAST MEDICAL HISTORY: Past Medical History  Diagnosis Date  . Anemia     PAST SURGICAL HISTORY: No past surgical history on file.  FAMILY HISTORY Family History  Problem Relation Age of Onset  . Diabetes Neg Hx        ADVANCED DIRECTIVES:    HEALTH MAINTENANCE: Social History  Substance Use Topics  . Smoking status: Never Smoker   . Smokeless tobacco: Not on file  . Alcohol Use: No     Colonoscopy:  PAP:  Bone density:  Lipid panel:  No Known Allergies  Current Outpatient Prescriptions    Medication Sig Dispense Refill  . ferrous sulfate 325 (65 FE) MG tablet Take 325 mg by mouth 3 (three) times daily with meals.    . Prenatal Multivit-Min-Fe-FA (PRENATAL VITAMINS) 0.8 MG tablet Take 1 tablet by mouth daily. 30 tablet 3   No current facility-administered medications for this visit.    OBJECTIVE: Filed Vitals:   08/18/15 0950  BP: 102/71  Pulse: 86  Temp: 97.8 F (36.6 C)  Resp: 18     Body mass index is 21.63 kg/(m^2).    ECOG FS:0 - Asymptomatic  General: Well-developed, well-nourished, no acute distress. Eyes: Pink conjunctiva, anicteric sclera. HEENT: Normocephalic, moist mucous membranes, clear oropharnyx. Lungs: Clear to auscultation bilaterally. Heart: Regular rate and rhythm. No rubs, murmurs, or gallops. Abdomen: Appears appropriate for gestational age. Musculoskeletal: No edema, cyanosis, or clubbing. Neuro: Alert, answering all questions appropriately. Cranial nerves grossly intact. Skin: No rashes or petechiae noted. Psych: Normal affect. Lymphatics: No cervical, calvicular, axillary or inguinal LAD.   LAB RESULTS:  Lab Results  Component Value Date   NA 132* 05/27/2015   K 3.6 05/27/2015   CL 102 05/27/2015   CO2 23 05/27/2015   GLUCOSE 98 05/27/2015   BUN 8 05/27/2015   CREATININE 0.47 05/27/2015   CALCIUM 8.5* 05/27/2015   PROT 7.3 05/27/2015   ALBUMIN 3.0* 05/27/2015   AST 24 05/27/2015   ALT 10* 05/27/2015   ALKPHOS 53 05/27/2015   BILITOT 0.9 05/27/2015   GFRNONAA >60 05/27/2015   GFRAA >60 05/27/2015    Lab Results  Component Value Date  WBC 4.8 08/18/2015   NEUTROABS 3.1 08/18/2015   HGB 10.1* 08/18/2015   HCT 30.8* 08/18/2015   MCV 83.6 08/18/2015   PLT 187 08/18/2015   Lab Results  Component Value Date   IRON 41 08/18/2015   TIBC 513* 08/18/2015   IRONPCTSAT 8* 08/18/2015    Lab Results  Component Value Date   FERRITIN 13 08/18/2015    STUDIES: No results found.  ASSESSMENT: Iron deficiency anemia in  pregnancy.  PLAN:    1. Iron deficiency anemia in pregnancy: Patient's hemoglobin and iron stores are found to be significantly decreased and she will benefit from IV iron. Return to clinic later this week and then again in 2 weeks for consideration of 510 mg of IV iron. Patient will then return to clinic the first week of August for repeat laboratory work, further evaluation, and consideration of additional IV iron. 2. Pregnancy: Patient reports her due date is October 18, 2015.  Patient expressed understanding and was in agreement with this plan. She also understands that She can call clinic at any time with any questions, concerns, or complaints.     Lloyd Huger, MD   08/24/2015 9:34 AM

## 2015-08-28 ENCOUNTER — Other Ambulatory Visit: Payer: Self-pay | Admitting: Oncology

## 2015-08-28 DIAGNOSIS — D509 Iron deficiency anemia, unspecified: Secondary | ICD-10-CM | POA: Insufficient documentation

## 2015-08-29 ENCOUNTER — Inpatient Hospital Stay: Payer: Medicaid Other

## 2015-08-29 VITALS — BP 100/65 | HR 101 | Temp 97.4°F

## 2015-08-29 DIAGNOSIS — D509 Iron deficiency anemia, unspecified: Secondary | ICD-10-CM

## 2015-08-29 DIAGNOSIS — O99013 Anemia complicating pregnancy, third trimester: Secondary | ICD-10-CM | POA: Diagnosis not present

## 2015-08-29 MED ORDER — SODIUM CHLORIDE 0.9 % IV SOLN
510.0000 mg | Freq: Once | INTRAVENOUS | Status: AC
  Administered 2015-08-29: 510 mg via INTRAVENOUS
  Filled 2015-08-29: qty 17

## 2015-08-29 MED ORDER — SODIUM CHLORIDE 0.9 % IV SOLN
Freq: Once | INTRAVENOUS | Status: AC
  Administered 2015-08-29: 14:00:00 via INTRAVENOUS
  Filled 2015-08-29: qty 1000

## 2015-09-15 ENCOUNTER — Other Ambulatory Visit: Payer: Self-pay | Admitting: Family Medicine

## 2015-09-15 DIAGNOSIS — O26843 Uterine size-date discrepancy, third trimester: Secondary | ICD-10-CM

## 2015-09-17 ENCOUNTER — Ambulatory Visit
Admission: RE | Admit: 2015-09-17 | Discharge: 2015-09-17 | Disposition: A | Discharge status: Home / Self Care | Payer: Medicaid Other | Source: Ambulatory Visit | Attending: Family Medicine | Admitting: Family Medicine

## 2015-09-17 DIAGNOSIS — O26843 Uterine size-date discrepancy, third trimester: Secondary | ICD-10-CM | POA: Diagnosis not present

## 2015-09-17 DIAGNOSIS — Z3A34 34 weeks gestation of pregnancy: Secondary | ICD-10-CM | POA: Diagnosis not present

## 2015-09-17 DIAGNOSIS — O99013 Anemia complicating pregnancy, third trimester: Secondary | ICD-10-CM | POA: Diagnosis not present

## 2015-09-26 LAB — OB RESULTS CONSOLE GBS: GBS: NEGATIVE

## 2015-10-08 ENCOUNTER — Other Ambulatory Visit: Payer: Self-pay | Admitting: *Deleted

## 2015-10-08 ENCOUNTER — Encounter: Payer: Self-pay | Admitting: Certified Nurse Midwife

## 2015-10-08 ENCOUNTER — Inpatient Hospital Stay: Payer: Medicaid Other | Admitting: Anesthesiology

## 2015-10-08 ENCOUNTER — Inpatient Hospital Stay
Admission: EM | Admit: 2015-10-08 | Discharge: 2015-10-09 | DRG: 775 | Disposition: A | Discharge status: Home / Self Care | Payer: Medicaid Other | Attending: Certified Nurse Midwife | Admitting: Certified Nurse Midwife

## 2015-10-08 DIAGNOSIS — D509 Iron deficiency anemia, unspecified: Secondary | ICD-10-CM | POA: Diagnosis present

## 2015-10-08 DIAGNOSIS — Z3A37 37 weeks gestation of pregnancy: Secondary | ICD-10-CM

## 2015-10-08 DIAGNOSIS — O9989 Other specified diseases and conditions complicating pregnancy, childbirth and the puerperium: Secondary | ICD-10-CM

## 2015-10-08 DIAGNOSIS — O9902 Anemia complicating childbirth: Principal | ICD-10-CM | POA: Diagnosis present

## 2015-10-08 DIAGNOSIS — Z79899 Other long term (current) drug therapy: Secondary | ICD-10-CM

## 2015-10-08 DIAGNOSIS — M549 Dorsalgia, unspecified: Secondary | ICD-10-CM | POA: Diagnosis present

## 2015-10-08 HISTORY — DX: Unspecified asthma, uncomplicated: J45.909

## 2015-10-08 HISTORY — DX: Herpesviral infection of urogenital system, unspecified: A60.00

## 2015-10-08 LAB — TYPE AND SCREEN
ABO/RH(D): A POS
Antibody Screen: NEGATIVE

## 2015-10-08 LAB — CBC
HEMATOCRIT: 40.7 % (ref 35.0–47.0)
HEMOGLOBIN: 13.9 g/dL (ref 12.0–16.0)
MCH: 29.9 pg (ref 26.0–34.0)
MCHC: 34.2 g/dL (ref 32.0–36.0)
MCV: 87.5 fL (ref 80.0–100.0)
Platelets: 127 10*3/uL — ABNORMAL LOW (ref 150–440)
RBC: 4.65 MIL/uL (ref 3.80–5.20)
RDW: 19.1 % — ABNORMAL HIGH (ref 11.5–14.5)
WBC: 4.7 10*3/uL (ref 3.6–11.0)

## 2015-10-08 MED ORDER — FENTANYL 2.5 MCG/ML W/ROPIVACAINE 0.2% IN NS 100 ML EPIDURAL INFUSION (ARMC-ANES)
EPIDURAL | Status: AC
  Filled 2015-10-08: qty 100

## 2015-10-08 MED ORDER — NALBUPHINE HCL 10 MG/ML IJ SOLN: 5.0000 mg | Freq: Once | INTRAMUSCULAR | Status: DC | PRN

## 2015-10-08 MED ORDER — OXYCODONE-ACETAMINOPHEN 5-325 MG PO TABS: 1.0000 | ORAL_TABLET | ORAL | Status: DC | PRN

## 2015-10-08 MED ORDER — LACTATED RINGERS IV SOLN: 500.0000 mL | INTRAVENOUS | Status: DC | PRN

## 2015-10-08 MED ORDER — NALBUPHINE HCL 10 MG/ML IJ SOLN: 5.0000 mg | INTRAMUSCULAR | Status: DC | PRN

## 2015-10-08 MED ORDER — NALOXONE HCL 0.4 MG/ML IJ SOLN: 0.4000 mg | INTRAMUSCULAR | Status: DC | PRN

## 2015-10-08 MED ORDER — SENNOSIDES-DOCUSATE SODIUM 8.6-50 MG PO TABS
2.0000 | ORAL_TABLET | ORAL | Status: DC
  Administered 2015-10-09: 2 via ORAL
  Filled 2015-10-08: qty 2

## 2015-10-08 MED ORDER — DIPHENHYDRAMINE HCL 50 MG/ML IJ SOLN: 12.5000 mg | INTRAMUSCULAR | Status: DC | PRN

## 2015-10-08 MED ORDER — FENTANYL CITRATE (PF) 100 MCG/2ML IJ SOLN
50.0000 ug | INTRAMUSCULAR | Status: DC | PRN
  Administered 2015-10-08: 50 ug via INTRAVENOUS
  Filled 2015-10-08: qty 2

## 2015-10-08 MED ORDER — ONDANSETRON HCL 4 MG/2ML IJ SOLN: 4.0000 mg | INTRAMUSCULAR | Status: DC | PRN

## 2015-10-08 MED ORDER — KETOROLAC TROMETHAMINE 30 MG/ML IJ SOLN: 30.0000 mg | Freq: Four times a day (QID) | INTRAMUSCULAR | Status: DC | PRN

## 2015-10-08 MED ORDER — BUPIVACAINE HCL (PF) 0.25 % IJ SOLN
INTRAMUSCULAR | Status: DC | PRN
  Administered 2015-10-08: 5 mL via EPIDURAL

## 2015-10-08 MED ORDER — ONDANSETRON HCL 4 MG/2ML IJ SOLN: 4.0000 mg | Freq: Three times a day (TID) | INTRAMUSCULAR | Status: DC | PRN

## 2015-10-08 MED ORDER — LIDOCAINE-EPINEPHRINE (PF) 1.5 %-1:200000 IJ SOLN
INTRAMUSCULAR | Status: DC | PRN
  Administered 2015-10-08: 3 mL via EPIDURAL

## 2015-10-08 MED ORDER — AMMONIA AROMATIC IN INHA
0.3000 mL | Freq: Once | RESPIRATORY_TRACT | Status: DC | PRN
  Filled 2015-10-08: qty 10

## 2015-10-08 MED ORDER — PRENATAL MULTIVITAMIN CH
1.0000 | ORAL_TABLET | Freq: Every day | ORAL | Status: DC
  Administered 2015-10-09: 1 via ORAL
  Filled 2015-10-08: qty 1

## 2015-10-08 MED ORDER — OXYTOCIN 40 UNITS IN LACTATED RINGERS INFUSION - SIMPLE MED
2.5000 [IU]/h | INTRAVENOUS | Status: DC
  Administered 2015-10-08: 39.96 [IU]/h via INTRAVENOUS
  Administered 2015-10-08: 2.5 [IU]/h via INTRAVENOUS
  Filled 2015-10-08: qty 1000

## 2015-10-08 MED ORDER — ONDANSETRON HCL 4 MG/2ML IJ SOLN
4.0000 mg | Freq: Four times a day (QID) | INTRAMUSCULAR | Status: DC | PRN
Start: 2015-10-08 — End: 2015-10-08
  Administered 2015-10-08: 4 mg via INTRAVENOUS
  Filled 2015-10-08: qty 2

## 2015-10-08 MED ORDER — IBUPROFEN 600 MG PO TABS
600.0000 mg | ORAL_TABLET | Freq: Four times a day (QID) | ORAL | Status: DC
  Administered 2015-10-08 – 2015-10-09 (×4): 600 mg via ORAL
  Filled 2015-10-08 (×4): qty 1

## 2015-10-08 MED ORDER — FENTANYL 2.5 MCG/ML W/ROPIVACAINE 0.2% IN NS 100 ML EPIDURAL INFUSION (ARMC-ANES): 10.0000 mL/h | EPIDURAL | Status: DC

## 2015-10-08 MED ORDER — DIPHENHYDRAMINE HCL 25 MG PO CAPS: 25.0000 mg | ORAL_CAPSULE | ORAL | Status: DC | PRN

## 2015-10-08 MED ORDER — BENZOCAINE-MENTHOL 20-0.5 % EX AERO: 1.0000 "application " | INHALATION_SPRAY | CUTANEOUS | Status: DC | PRN

## 2015-10-08 MED ORDER — LACTATED RINGERS IV SOLN: INTRAVENOUS | Status: DC

## 2015-10-08 MED ORDER — ONDANSETRON HCL 4 MG PO TABS: 4.0000 mg | ORAL_TABLET | ORAL | Status: DC | PRN

## 2015-10-08 MED ORDER — NALOXONE HCL 2 MG/2ML IJ SOSY
1.0000 ug/kg/h | PREFILLED_SYRINGE | INTRAMUSCULAR | Status: DC | PRN
Start: 2015-10-08 — End: 2015-10-09
  Filled 2015-10-08: qty 2

## 2015-10-08 MED ORDER — LIDOCAINE HCL (PF) 1 % IJ SOLN
30.0000 mL | INTRAMUSCULAR | Status: DC | PRN
  Filled 2015-10-08: qty 30

## 2015-10-08 MED ORDER — LIDOCAINE HCL (PF) 1 % IJ SOLN
INTRAMUSCULAR | Status: DC | PRN
  Administered 2015-10-08: 3 mL via SUBCUTANEOUS

## 2015-10-08 MED ORDER — FERROUS SULFATE 325 (65 FE) MG PO TABS
325.0000 mg | ORAL_TABLET | Freq: Every day | ORAL | Status: DC
  Administered 2015-10-09: 325 mg via ORAL
  Filled 2015-10-08: qty 1

## 2015-10-08 MED ORDER — SODIUM CHLORIDE 0.9% FLUSH: 3.0000 mL | INTRAVENOUS | Status: DC | PRN

## 2015-10-08 MED ORDER — MISOPROSTOL 200 MCG PO TABS
800.0000 ug | ORAL_TABLET | Freq: Once | ORAL | Status: DC | PRN
  Filled 2015-10-08: qty 4

## 2015-10-08 MED ORDER — DIBUCAINE 1 % RE OINT: 1.0000 "application " | TOPICAL_OINTMENT | RECTAL | Status: DC | PRN

## 2015-10-08 MED ORDER — OXYTOCIN BOLUS FROM INFUSION: 500.0000 mL | Freq: Once | INTRAVENOUS | Status: DC

## 2015-10-08 MED ORDER — MEPERIDINE HCL 25 MG/ML IJ SOLN: 6.2500 mg | INTRAMUSCULAR | Status: DC | PRN

## 2015-10-08 MED ORDER — SODIUM CHLORIDE 0.9 % IV SOLN: 510.0000 mg | Freq: Once | INTRAVENOUS | Status: DC

## 2015-10-08 MED ORDER — SIMETHICONE 80 MG PO CHEW
80.0000 mg | CHEWABLE_TABLET | ORAL | Status: DC | PRN
  Filled 2015-10-08: qty 1

## 2015-10-08 MED ORDER — SCOPOLAMINE 1 MG/3DAYS TD PT72: 1.0000 | MEDICATED_PATCH | Freq: Once | TRANSDERMAL | Status: DC

## 2015-10-08 MED ORDER — WITCH HAZEL-GLYCERIN EX PADS: 1.0000 "application " | MEDICATED_PAD | CUTANEOUS | Status: DC | PRN

## 2015-10-08 MED ORDER — COCONUT OIL OIL: 1.0000 "application " | TOPICAL_OIL | Status: DC | PRN

## 2015-10-08 MED ORDER — FENTANYL 2.5 MCG/ML W/ROPIVACAINE 0.2% IN NS 100 ML EPIDURAL INFUSION (ARMC-ANES)
EPIDURAL | Status: DC | PRN
  Administered 2015-10-08: 9 mL/h via EPIDURAL

## 2015-10-08 MED ORDER — OXYCODONE-ACETAMINOPHEN 5-325 MG PO TABS: 2.0000 | ORAL_TABLET | ORAL | Status: DC | PRN

## 2015-10-08 NOTE — H&P (Signed)
OB History & Physical   History of Present Illness:  Chief Complaint:  Contractions woke her up at 7 AM, are were every 10 min aprt on presentation to L&D. Denied bleeding HPI:  Lindsey Cain is a 30 y.o. G58P0003 female with EDC=8/17/2017at [redacted]w[redacted]d dated by LMP=01/16/2015.  Her pregnancy has been complicated by late entry to care in mid second trimester, iron deficiency anemia requiring iron transfusions x 2, a history of HSV II (denies outbreaks this pregnancy-is not taking PPX), pica and a history of asthma..  She presents to L&D for evaluation of labor.  On arrival, her cervix was not reachable and contractions were irregular,  but her contractions became stronger and more frequent with ambulation and her cervix progressed to 5-6cm dilation.   Prenatal care site: Prenatal care at [facility] with consultation of heme[facility]onc [facility]. Desires to breast feed. Depo for contraception, TDAP given 07/31/2015      Maternal Medical History:   Past Medical History:  Diagnosis Date  . Anemia   . Asthma   . Herpes genitalis     No past surgical history on file.  No Known Allergies  Prior to Admission medications   Medication Sig Start Date End Date Taking? Authorizing Provider  ferrous sulfate 325 (65 FE) MG tablet Take 325 mg by mouth 3 (three) times daily with meals.    Historical Provider, MD  Prenatal Multivit-Min-Fe-FA (PRENATAL VITAMINS) 0.8 MG tablet Take 1 tablet by mouth daily. 05/27/15   Carrie Mew, MD          Social History: She  reports that she has never smoked. She does not have any smokeless tobacco history on file. She reports that she does not drink alcohol or use drugs.  Family History: family history is not on file.   Review of Systems: Negative x 10 systems reviewed except as noted in the HPI.      Physical Exam:  Vital Signs: BP 110/70 (BP Location: Right Arm)   Pulse 94   Temp 97.8 F (36.6 C) (Oral)   Resp 18   Ht 5\' 5"  (1.651 m)   Wt 65.8 kg (145 lb)    LMP 01/16/2015   BMI 24.13 kg/m  General: breathing with contractions HEENT: normocephalic, atraumatic Heart: regular rate & rhythm.  No murmurs Lungs: clear to auscultation bilaterally Abdomen: soft, gravid, tender with contractions  EFW: 7# Pelvic:   External: Normal external female genitalia (no external herpetic  lesions seen)  Cervix: 5-6/70%/-1  Extremities: non-tender, symmetric, no edema bilaterally.                DTRs: +2 Neurologic: Alert & oriented x 3.    Pertinent Results:  Prenatal Labs: Blood type/Rh A positive  Antibody screen negative  Rubella Varicella Immune immune  RPR nonreactive  HBsAg negative  HIV Non reactive  GC negative  Chlamydia negative  Genetic screening NA  1 hour GTT 111  3 hour GTT NA  GBS negative on 09/29/2015   Baseline FHR: 135-140 with accelerations to 150s to 160s, moderate variability Toco: contractions q4-5 min apart, palpate strong   Assessment:  Lindsey Cain is a 30 y.o. G29P0003 female at [redacted]w[redacted]d in labor. Anticipate vaginal delivery  Plan:  1. Admit to Labor & Delivery  2. CBC, T&S, Clrs, IVF 3. GBS negative.   4. Consents obtained. 5. Epidural if desires. Fentanyl for pain if desires 6. Monitor progress  Dalayah Deahl  10/08/2015 1:45 PM

## 2015-10-08 NOTE — Discharge Instructions (Signed)
Vaginal Delivery, Care After Refer to this sheet in the next few weeks. These discharge instructions provide you with information on caring for yourself after delivery. Your caregiver may also give you specific instructions. Your treatment has been planned according to the most current medical practices available, but problems sometimes occur. Call your caregiver if you have any problems or questions after you go home. HOME CARE INSTRUCTIONS 1. Take over-the-counter or prescription medicines only as directed by your caregiver or pharmacist. 2. Do not drink alcohol, especially if you are breastfeeding or taking medicine to relieve pain. 3. Do not smoke tobacco. 4. Continue to use good perineal care. Good perineal care includes: 1. Wiping your perineum from back to front 2. Keeping your perineum clean. 3. You can do sitz baths twice a day, to help keep this area clean 5. Do not use tampons, douche or have sex until your caregiver says it is okay. 6. Shower only and avoid sitting in submerged water, aside from sitz baths 7. Wear a well-fitting bra that provides breast support. 8. Eat healthy foods. 9. Drink enough fluids to keep your urine clear or pale yellow. 10. Eat high-fiber foods such as whole grain cereals and breads, brown rice, beans, and fresh fruits and vegetables every day. These foods may help prevent or relieve constipation. 11. Avoid constipation with high fiber foods or medications, such as miralax or metamucil 12. Follow your caregiver's recommendations regarding resumption of activities such as climbing stairs, driving, lifting, exercising, or traveling. 13. Talk to your caregiver about resuming sexual activities. Resumption of sexual activities is dependent upon your risk of infection, your rate of healing, and your comfort and desire to resume sexual activity. 14. Try to have someone help you with your household activities and your newborn for at least a few days after you leave  the hospital. 15. Rest as much as possible. Try to rest or take a nap when your newborn is sleeping. 16. Increase your activities gradually. 17. Keep all of your scheduled postpartum appointments. It is very important to keep your scheduled follow-up appointments. At these appointments, your caregiver will be checking to make sure that you are healing physically and emotionally. SEEK MEDICAL CARE IF:   You are passing large clots from your vagina. Save any clots to show your caregiver.  You have a foul smelling discharge from your vagina.  You have trouble urinating.  You are urinating frequently.  You have pain when you urinate.  You have a change in your bowel movements.  You have increasing redness, pain, or swelling near your vaginal incision (episiotomy) or vaginal tear.  You have pus draining from your episiotomy or vaginal tear.  Your episiotomy or vaginal tear is separating.  You have painful, hard, or reddened breasts.  You have a severe headache.  You have blurred vision or see spots.  You feel sad or depressed.  You have thoughts of hurting yourself or your newborn.  You have questions about your care, the care of your newborn, or medicines.  You are dizzy or light-headed.  You have a rash.  You have nausea or vomiting.  You were breastfeeding and have not had a menstrual period within 12 weeks after you stopped breastfeeding.  You are not breastfeeding and have not had a menstrual period by the 12th week after delivery.  You have a fever. SEEK IMMEDIATE MEDICAL CARE IF:   You have persistent pain.  You have chest pain.  You have shortness of breath.    You faint.  You have leg pain.  You have stomach pain.  Your vaginal bleeding saturates two or more sanitary pads in 1 hour. MAKE SURE YOU:   Understand these instructions.  Will watch your condition.  Will get help right away if you are not doing well or get worse. Document Released:  02/20/2000 Document Revised: 07/09/2013 Document Reviewed: 10/20/2011 Exit[employer] Patient Information 2015 [employer]. This information is not intended to replace advice given to you by your health care provider. Make sure you discuss any questions you have with your health care provider.  Sitz Bath A sitz bath is a warm water bath taken in the sitting position. The water covers only the hips and butt (buttocks). We recommend using one that fits in the toilet, to help with ease of use and cleanliness. It may be used for either healing or cleaning purposes. Sitz baths are also used to relieve pain, itching, or muscle tightening (spasms). The water may contain medicine. Moist heat will help you heal and relax.  HOME CARE  Take 3 to 4 sitz baths a day. 18. Fill the bathtub half-full with warm water. 19. Sit in the water and open the drain a little. 20. Turn on the warm water to keep the tub half-full. Keep the water running constantly. 21. Soak in the water for 15 to 20 minutes. 22. After the sitz bath, pat the affected area dry. GET HELP RIGHT AWAY IF: You get worse instead of better. Stop the sitz baths if you get worse. MAKE SURE YOU:  Understand these instructions.  Will watch your condition.  Will get help right away if you are not doing well or get worse. Document Released: 04/01/2004 Document Revised: 11/17/2011 Document Reviewed: 06/22/2010 Exit[employer] Patient Information 2015 [employer]. This information is not intended to replace advice given to you by your health care provider. Make sure you discuss any questions you have with your health care provider.    

## 2015-10-08 NOTE — Anesthesia Preprocedure Evaluation (Addendum)
Anesthesia Evaluation  Patient identified by MRN, date of birth, ID band Patient awake    Reviewed: Allergy & Precautions, NPO status , Patient's Chart, lab work & pertinent test results, reviewed documented beta blocker date and time   Airway Mallampati: II  TM Distance: >3 FB     Dental  (+) Chipped   Pulmonary asthma ,           Cardiovascular      Neuro/Psych    GI/Hepatic   Endo/Other    Renal/GU      Musculoskeletal   Abdominal   Peds  Hematology  (+) anemia ,   Anesthesia Other Findings   Reproductive/Obstetrics                             Anesthesia Physical Anesthesia Plan  ASA: II  Anesthesia Plan: Epidural   Post-op Pain Management:    Induction:   Airway Management Planned:   Additional Equipment:   Intra-op Plan:   Post-operative Plan:   Informed Consent: I have reviewed the patients History and Physical, chart, labs and discussed the procedure including the risks, benefits and alternatives for the proposed anesthesia with the patient or authorized representative who has indicated his/her understanding and acceptance.     Plan Discussed with: CRNA  Anesthesia Plan Comments:        Anesthesia Quick Evaluation

## 2015-10-08 NOTE — OB Triage Note (Signed)
Lindsey Cain here with c/o ctx, approx every 15 min since 7 AM, denies bleeding, LOF. Reports +FM.

## 2015-10-08 NOTE — Discharge Summary (Signed)
Physician Obstetric Discharge Summary  Patient ID: Lindsey Cain MRN: [id number] DOB/AGE: January 21, 1986 30 y.o.   Date of Admission: 10/08/2015  Date of Discharge: 10/09/15  Admitting Diagnosis: Onset of Labor at [redacted]w[redacted]d  Secondary Diagnosis: Anemia in pregnancy  Mode of Delivery: normal spontaneous vaginal delivery 10/08/2015      Discharge Diagnosis: Term intrauterin pregnancy-delivered   Intrapartum Procedures: Atificial rupture of membranes and epidural   Post partum procedures: none  Complications: none   Brief Hospital Course  Lindsey Cain is a [id number] who had a SVD on 10/08/2015;  for further details of this delivery, please refer to the delivery note.  Patient had an uncomplicated postpartum course.  By time of discharge on PPD#1, her pain was controlled on oral pain medications; she had appropriate lochia and was ambulating, voiding without difficulty and tolerating regular diet.  She was deemed stable for discharge to home.     Labs: CBC Latest Ref Rng & Units 10/08/2015 08/18/2015 05/27/2015  WBC 3.6 - 11.0 K/uL 4.7 4.8 4.6  Hemoglobin 12.0 - 16.0 g/dL 13.9 10.1(L) 10.5(L)  Hematocrit 35.0 - 47.0 % 40.7 30.8(L) 31.0(L)  Platelets 150 - 440 K/uL 127(L) 187 175   Postpartum Hct: 36.8  A POS  Physical exam:  Blood pressure 119/77, pulse 88, temperature 97.8 F (36.6 C), temperature source Oral, resp. rate 18, height 5\' 5"  (1.651 m), weight 65.8 kg (145 lb), last menstrual period 01/16/2015, unknown if currently breastfeeding. General: alert and no distress Lochia: appropriate Abdomen: soft, NT Uterine Fundus: firm Extremities: No evidence of DVT seen on physical exam. No lower extremity edema.  Discharge Instructions: Per After Visit Summary. Activity: Advance as tolerated. Pelvic rest for 6 weeks.  Also refer to Discharge Instructions Diet: Regular Medications:   Medication List    TAKE these medications   ferrous sulfate 325 (65 FE) MG tablet Take 325 mg by  mouth 3 (three) times daily with meals.   ibuprofen 600 MG tablet Commonly known as:  ADVIL,MOTRIN Take 1 tablet (600 mg total) by mouth every 6 (six) hours.   Prenatal Vitamins 0.8 MG tablet Take 1 tablet by mouth daily.       Outpatient follow up:  Follow-up [facility]. Schedule an appointment as soon as possible for a visit today.   Why:  call for 6 week postpartum visit Contact information: [facility] [place] [id number] [phone]          Postpartum contraception: Depo-Proveran - given prior to discharge  Discharged Condition: good  Discharged to: home   Newborn Data: Disposition:home with mother  Apgars: APGAR (1 MIN): 8   APGAR (5 MINS): 9   APGAR (10 MINS):    Baby Feeding: Bottle and Breast  Dalia Heading, CNM 10/08/2015 4:21 PM  Updated 10/09/15 10:53 AM Brothers, Jonelle Sidle, CNM

## 2015-10-08 NOTE — Anesthesia Procedure Notes (Signed)
Epidural Patient location during procedure: OB  Staffing Anesthesiologist: Gunnar Bulla Resident/CRNA: Rolla Plate Performed: resident/CRNA   Preanesthetic Checklist Completed: patient identified, site marked, surgical consent, pre-op evaluation, timeout performed, IV checked, risks and benefits discussed and monitors and equipment checked  Epidural Patient position: sitting Prep: ChloraPrep and site prepped and draped Patient monitoring: heart rate, continuous pulse ox and blood pressure Approach: midline Location: L4-L5 Injection technique: LOR saline  Needle:  Needle type: Tuohy  Needle gauge: 18 G Needle length: 9 cm Needle insertion depth: 7 cm Catheter type: closed end flexible Catheter size: 20 Guage Catheter at skin depth: 12 cm Test dose: negative and 1.5% lidocaine with Epi 1:200 K  Assessment Events: blood not aspirated, injection not painful, no injection resistance, negative IV test and no paresthesia  Additional Notes   Patient tolerated the insertion well without complications.Reason for block:procedure for pain

## 2015-10-09 ENCOUNTER — Inpatient Hospital Stay: Payer: Medicaid Other | Admitting: Oncology

## 2015-10-09 ENCOUNTER — Inpatient Hospital Stay: Payer: Medicaid Other

## 2015-10-09 LAB — CBC
HCT: 36.8 % (ref 35.0–47.0)
HEMOGLOBIN: 12.7 g/dL (ref 12.0–16.0)
MCH: 30.4 pg (ref 26.0–34.0)
MCHC: 34.5 g/dL (ref 32.0–36.0)
MCV: 88 fL (ref 80.0–100.0)
Platelets: 128 10*3/uL — ABNORMAL LOW (ref 150–440)
RBC: 4.18 MIL/uL (ref 3.80–5.20)
RDW: 18.9 % — ABNORMAL HIGH (ref 11.5–14.5)
WBC: 7.7 10*3/uL (ref 3.6–11.0)

## 2015-10-09 LAB — RPR: RPR: NONREACTIVE

## 2015-10-09 MED ORDER — IBUPROFEN 600 MG PO TABS: 600.0000 mg | ORAL_TABLET | Freq: Four times a day (QID) | ORAL | 0 refills | Status: DC

## 2015-10-09 MED ORDER — MEDROXYPROGESTERONE ACETATE 150 MG/ML IM SUSP: 150.0000 mg | Freq: Once | INTRAMUSCULAR | 3 refills | Status: DC

## 2015-10-09 MED ORDER — MEDROXYPROGESTERONE ACETATE 150 MG/ML IM SUSP
150.0000 mg | Freq: Once | INTRAMUSCULAR | Status: AC
  Administered 2015-10-09: 150 mg via INTRAMUSCULAR
  Filled 2015-10-09: qty 1

## 2015-10-09 NOTE — Progress Notes (Signed)
Discharge instructions reviewed with patient who verbalized understanding.  Prescriptions given.  Patient discharged home with infant.

## 2015-10-09 NOTE — Anesthesia Postprocedure Evaluation (Signed)
Anesthesia Post Note  Patient: Lindsey Cain  Procedure(s) Performed: * No procedures listed *  Patient location during evaluation: Other Anesthesia Type: Epidural Level of consciousness: awake and alert Pain management: pain level controlled Vital Signs Assessment: post-procedure vital signs reviewed and stable Respiratory status: spontaneous breathing, nonlabored ventilation and respiratory function stable Cardiovascular status: stable Postop Assessment: no headache, no backache and epidural receding Anesthetic complications: no    Last Vitals:  Vitals:   10/09/15 0322 10/09/15 0809  BP: 107/65 108/61  Pulse: 65 79  Resp: 20 16  Temp: 36.8 C 36.8 C    Last Pain:  Vitals:   10/09/15 0809  TempSrc: Oral  PainSc:                  [name]

## 2015-10-09 NOTE — Progress Notes (Deleted)
[place]  [phone]) [phone] Fax:(336) [phone]  ID: Twana First OB: 06-Jun-1985  MR#: [id number]  [id number]  Patient Care Team: [facility] Department as PCP - General  CHIEF COMPLAINT: Iron deficiency anemia.  INTERVAL HISTORY: Patient is a 30 year old female who was found to have a declining hemoglobin and the third trimester pregnancy and routine blood work. She currently feels well and is asymptomatic. She is gaining weight appropriately. She has no neurologic complaints. She denies any recent fevers. She does not complain of weakness or fatigue. She has no chest pain or shortness of breath. She denies any nausea, vomiting, constipation, or diarrhea. She has no melena or hematochezia. She has no urinary complaints. Patient offers no specific complaints today.   REVIEW OF SYSTEMS:   Review of Systems  Constitutional: Negative.  Negative for fever, malaise/fatigue and weight loss.  Respiratory: Negative.  Negative for cough and shortness of breath.   Cardiovascular: Negative.  Negative for chest pain.  Gastrointestinal: Negative.  Negative for blood in stool and melena.  Musculoskeletal: Negative.   Neurological: Negative.  Negative for weakness.  Psychiatric/Behavioral: Negative.     As per HPI. Otherwise, a complete review of systems is negatve.  PAST MEDICAL HISTORY: Past Medical History:  Diagnosis Date  . Anemia   . Asthma   . Herpes genitalis     PAST SURGICAL HISTORY: No past surgical history on file.  FAMILY HISTORY Family History  Problem Relation Age of Onset  . Diabetes Neg Hx        ADVANCED DIRECTIVES:    HEALTH MAINTENANCE: Social History  Substance Use Topics  . Smoking status: Never Smoker  . Smokeless tobacco: Never Used  . Alcohol use No     Colonoscopy:  PAP:  Bone density:  Lipid panel:  No Known Allergies  No current facility-administered medications for this visit.    No current  outpatient prescriptions on file.   Facility-Administered Medications Ordered in Other Visits  Medication Dose Route Frequency Provider Last Rate Last Dose  . benzocaine-Menthol (DERMOPLAST) 20-0.5 % topical spray 1 application  1 application Topical PRN Dalia Heading, CNM      . coconut oil  1 application Topical PRN Dalia Heading, CNM      . witch hazel-glycerin (TUCKS) pad 1 application  1 application Topical PRN Dalia Heading, CNM       And  . dibucaine (NUPERCAINAL) 1 % rectal ointment 1 application  1 application Rectal PRN Dalia Heading, CNM      . diphenhydrAMINE (BENADRYL) injection 12.5 mg  12.5 mg Intravenous Q4H PRN Gunnar Bulla, MD       Or  . diphenhydrAMINE (BENADRYL) capsule 25 mg  25 mg Oral Q4H PRN Gunnar Bulla, MD      . fentaNYL 2.5 mcg/ml w/ropivacaine 0.2% (preservative free) in normal saline 100 mL EPIDURAL Infusion in 150 ml Intravia Bag  10 mL/hr Epidural Continuous Gunnar Bulla, MD      . ferrous sulfate tablet 325 mg  325 mg Oral Q breakfast Dalia Heading, CNM   325 mg at 10/09/15 0844  . ibuprofen (ADVIL,MOTRIN) tablet 600 mg  600 mg Oral Q6H Dalia Heading, CNM   600 mg at 10/09/15 0843  . meperidine (DEMEROL) injection 6.25 mg  6.25 mg Intravenous Q5 min PRN Gunnar Bulla, MD      . nalbuphine (NUBAIN) injection 5 mg  5 mg Intravenous Q4H PRN Gunnar Bulla, MD       Or  .  nalbuphine (NUBAIN) injection 5 mg  5 mg Subcutaneous Q4H PRN Gunnar Bulla, MD      . nalbuphine (NUBAIN) injection 5 mg  5 mg Intravenous Once PRN Gunnar Bulla, MD       Or  . nalbuphine (NUBAIN) injection 5 mg  5 mg Subcutaneous Once PRN Gunnar Bulla, MD      . naloxone [facility]) 2 mg in dextrose 5 % 250 mL infusion  1-4 mcg/kg/hr Intravenous Continuous PRN Gunnar Bulla, MD      . naloxone [facility]) injection 0.4 mg  0.4 mg Intravenous PRN Gunnar Bulla, MD       And  . sodium chloride flush (NS) 0.9 % injection 3 mL  3 mL Intravenous PRN Gunnar Bulla, MD      .  ondansetron (ZOFRAN) injection 4 mg  4 mg Intravenous Q8H PRN Gunnar Bulla, MD      . ondansetron (ZOFRAN) tablet 4 mg  4 mg Oral Q4H PRN Dalia Heading, CNM       Or  . ondansetron (ZOFRAN) injection 4 mg  4 mg Intravenous Q4H PRN Dalia Heading, CNM      . oxyCODONE-acetaminophen (PERCOCET/ROXICET) 5-325 MG per tablet 1 tablet  1 tablet Oral Q4H PRN Dalia Heading, CNM      . oxyCODONE-acetaminophen (PERCOCET/ROXICET) 5-325 MG per tablet 2 tablet  2 tablet Oral Q4H PRN Dalia Heading, CNM      . prenatal multivitamin tablet 1 tablet  1 tablet Oral Q1200 Dalia Heading, CNM      . scopolamine (TRANSDERM-SCOP) 1 MG/3DAYS 1.5 mg  1 patch Transdermal Once Gunnar Bulla, MD      . senna-docusate (Senokot-S) tablet 2 tablet  2 tablet Oral Q24H Dalia Heading, CNM   2 tablet at 10/09/15 0844  . simethicone (MYLICON) chewable tablet 80 mg  80 mg Oral PRN Dalia Heading, CNM        OBJECTIVE: There were no vitals filed for this visit.   There is no height or weight on file to calculate BMI.    ECOG FS:0 - Asymptomatic  General: Well-developed, well-nourished, no acute distress. Eyes: Pink conjunctiva, anicteric sclera. HEENT: Normocephalic, moist mucous membranes, clear oropharnyx. Lungs: Clear to auscultation bilaterally. Heart: Regular rate and rhythm. No rubs, murmurs, or gallops. Abdomen: Appears appropriate for gestational age. Musculoskeletal: No edema, cyanosis, or clubbing. Neuro: Alert, answering all questions appropriately. Cranial nerves grossly intact. Skin: No rashes or petechiae noted. Psych: Normal affect. Lymphatics: No cervical, calvicular, axillary or inguinal LAD.   LAB RESULTS:  Lab Results  Component Value Date   NA 132 (L) 05/27/2015   K 3.6 05/27/2015   CL 102 05/27/2015   CO2 23 05/27/2015   GLUCOSE 98 05/27/2015   BUN 8 05/27/2015   CREATININE 0.47 05/27/2015   CALCIUM 8.5 (L) 05/27/2015   PROT 7.3 05/27/2015   ALBUMIN 3.0 (L)  05/27/2015   AST 24 05/27/2015   ALT 10 (L) 05/27/2015   ALKPHOS 53 05/27/2015   BILITOT 0.9 05/27/2015   GFRNONAA >60 05/27/2015   GFRAA >60 05/27/2015    Lab Results  Component Value Date   WBC 7.7 10/09/2015   NEUTROABS 3.1 08/18/2015   HGB 12.7 10/09/2015   HCT 36.8 10/09/2015   MCV 88.0 10/09/2015   PLT 128 (L) 10/09/2015   Lab Results  Component Value Date   IRON 41 08/18/2015   TIBC 513 (H) 08/18/2015   IRONPCTSAT 8 (L) 08/18/2015    Lab Results  Component Value Date  FERRITIN 13 08/18/2015    STUDIES: US Ob Follow Up  Result Date: 09/17/2015 CLINICAL DATA:  30 year old at 34 weeks 5 days by assigned EDC of 10/24/2015. Size and dates discrepancy. EXAM: OBSTETRIC 14+ WK ULTRASOUND FOLLOW-UP FINDINGS: Number of Fetuses: 1 Heart Rate:  149 bpm Movement: Present Presentation: Cephalic Previa: None Placental Location: Posterior Amniotic Fluid (Subjective): Normal Amniotic Fluid (Objective): AFI 12.3 cm cm (5%ile= 8.0 cm, 95%= 24.8 cm for 34 wks) FETAL BIOMETRY BPD:  9.09cm 36w   6d HC:    31.96cm  36w   0d AC:   31.89cm  35w   5d FL:   6.93cm  35w   4d Current Mean GA: 35w 6d          [place] EDC: 10/16/2015 Assigned GA:  34w 5d              Assigned EDC:  10/24/2015 Estimated Fetal Weight:  2,785g 78%ile FETAL ANATOMY Lateral Ventricles: Visualized Thalami/CSP: Previously visualized Posterior Fossa:  Previously visualized Nuchal Region: Previously visualized Upper Lip: Previously visualized Spine: Previously visualized 4 Chamber Heart on Left: Visualized LVOT: Visualized RVOT: Visualized Stomach on Left: Visualized 3 Vessel Cord: Previously visualized Cord Insertion site: Previously visualized Kidneys: Visualized Bladder: Visualized Extremities: Previously visualized Sex: Female Technically difficult due to: Advanced gestational age MATERNAL FINDINGS: Cervix: Not assessed IMPRESSION: 1. Single living intrauterine fetus in cephalic presentation. 2. Size by ultrasound is in the  seventy-eighth percentile for gestational age. 3. No fetal anomalies are identified. Electronically Signed   By: Nolon Nations M.D.   On: 09/17/2015 14:04    ASSESSMENT: Iron deficiency anemia in pregnancy.  PLAN:    1. Iron deficiency anemia in pregnancy: Patient's hemoglobin and iron stores are found to be significantly decreased and she will benefit from IV iron. Return to clinic later this week and then again in 2 weeks for consideration of 510 mg of IV iron. Patient will then return to clinic the first week of August for repeat laboratory work, further evaluation, and consideration of additional IV iron. 2. Pregnancy: Patient reports her due date is October 18, 2015.  Patient expressed understanding and was in agreement with this plan. She also understands that She can call clinic at any time with any questions, concerns, or complaints.     Lloyd Huger, MD   10/09/2015 8:54 AM

## 2015-10-22 ENCOUNTER — Encounter
Admission: EM | Disposition: A | Discharge status: Home / Self Care | Payer: Self-pay | Source: Home / Self Care | Attending: Emergency Medicine

## 2015-10-22 ENCOUNTER — Emergency Department: Payer: Medicaid Other | Admitting: Anesthesiology

## 2015-10-22 ENCOUNTER — Observation Stay
Admission: EM | Admit: 2015-10-22 | Discharge: 2015-10-24 | Disposition: A | Discharge status: Home / Self Care | Payer: Medicaid Other | Attending: Obstetrics & Gynecology | Admitting: Obstetrics & Gynecology

## 2015-10-22 ENCOUNTER — Emergency Department: Payer: Medicaid Other

## 2015-10-22 DIAGNOSIS — Z791 Long term (current) use of non-steroidal anti-inflammatories (NSAID): Secondary | ICD-10-CM | POA: Diagnosis not present

## 2015-10-22 DIAGNOSIS — O9953 Diseases of the respiratory system complicating the puerperium: Secondary | ICD-10-CM | POA: Diagnosis not present

## 2015-10-22 DIAGNOSIS — O8612 Endometritis following delivery: Secondary | ICD-10-CM | POA: Diagnosis not present

## 2015-10-22 DIAGNOSIS — Z793 Long term (current) use of hormonal contraceptives: Secondary | ICD-10-CM | POA: Insufficient documentation

## 2015-10-22 DIAGNOSIS — R102 Pelvic and perineal pain: Secondary | ICD-10-CM | POA: Diagnosis present

## 2015-10-22 DIAGNOSIS — J45909 Unspecified asthma, uncomplicated: Secondary | ICD-10-CM | POA: Diagnosis not present

## 2015-10-22 DIAGNOSIS — N719 Inflammatory disease of uterus, unspecified: Secondary | ICD-10-CM | POA: Diagnosis present

## 2015-10-22 DIAGNOSIS — O9833 Other infections with a predominantly sexual mode of transmission complicating the puerperium: Secondary | ICD-10-CM | POA: Diagnosis not present

## 2015-10-22 DIAGNOSIS — Z79899 Other long term (current) drug therapy: Secondary | ICD-10-CM | POA: Insufficient documentation

## 2015-10-22 DIAGNOSIS — O9081 Anemia of the puerperium: Secondary | ICD-10-CM | POA: Diagnosis not present

## 2015-10-22 DIAGNOSIS — A6000 Herpesviral infection of urogenital system, unspecified: Secondary | ICD-10-CM | POA: Insufficient documentation

## 2015-10-22 HISTORY — PX: DILATION AND CURETTAGE OF UTERUS: SHX78

## 2015-10-22 LAB — WET PREP, GENITAL
Clue Cells Wet Prep HPF POC: NONE SEEN
Sperm: NONE SEEN
Trich, Wet Prep: NONE SEEN
Yeast Wet Prep HPF POC: NONE SEEN

## 2015-10-22 LAB — COMPREHENSIVE METABOLIC PANEL
ALK PHOS: 115 U/L (ref 38–126)
ALT: 17 U/L (ref 14–54)
AST: 27 U/L (ref 15–41)
Albumin: 3.5 g/dL (ref 3.5–5.0)
Anion gap: 6 (ref 5–15)
BILIRUBIN TOTAL: 1.1 mg/dL (ref 0.3–1.2)
BUN: 8 mg/dL (ref 6–20)
CALCIUM: 8.8 mg/dL — AB (ref 8.9–10.3)
CO2: 21 mmol/L — ABNORMAL LOW (ref 22–32)
CREATININE: 0.59 mg/dL (ref 0.44–1.00)
Chloride: 104 mmol/L (ref 101–111)
Glucose, Bld: 114 mg/dL — ABNORMAL HIGH (ref 65–99)
Potassium: 3.2 mmol/L — ABNORMAL LOW (ref 3.5–5.1)
Sodium: 131 mmol/L — ABNORMAL LOW (ref 135–145)
TOTAL PROTEIN: 7.9 g/dL (ref 6.5–8.1)

## 2015-10-22 LAB — URINALYSIS COMPLETE WITH MICROSCOPIC (ARMC ONLY)
BILIRUBIN URINE: NEGATIVE
GLUCOSE, UA: NEGATIVE mg/dL
KETONES UR: NEGATIVE mg/dL
NITRITE: NEGATIVE
PH: 6 (ref 5.0–8.0)
Protein, ur: NEGATIVE mg/dL
Specific Gravity, Urine: 1.015 (ref 1.005–1.030)

## 2015-10-22 LAB — HCG, QUANTITATIVE, PREGNANCY: HCG, BETA CHAIN, QUANT, S: 34 m[IU]/mL — AB (ref <5)

## 2015-10-22 LAB — CHLAMYDIA/NGC RT PCR (ARMC ONLY)
CHLAMYDIA TR: NOT DETECTED
N gonorrhoeae: NOT DETECTED

## 2015-10-22 LAB — CBC
HEMATOCRIT: 46.7 % (ref 35.0–47.0)
Hemoglobin: 15.5 g/dL (ref 12.0–16.0)
MCH: 29.1 pg (ref 26.0–34.0)
MCHC: 33.2 g/dL (ref 32.0–36.0)
MCV: 87.5 fL (ref 80.0–100.0)
PLATELETS: 135 10*3/uL — AB (ref 150–440)
RBC: 5.34 MIL/uL — ABNORMAL HIGH (ref 3.80–5.20)
RDW: 17.2 % — AB (ref 11.5–14.5)
WBC: 13.4 10*3/uL — AB (ref 3.6–11.0)

## 2015-10-22 LAB — PREGNANCY, URINE: PREG TEST UR: POSITIVE — AB

## 2015-10-22 LAB — LACTIC ACID, PLASMA: LACTIC ACID, VENOUS: 1 mmol/L (ref 0.5–1.9)

## 2015-10-22 LAB — LIPASE, BLOOD: LIPASE: 17 U/L (ref 11–51)

## 2015-10-22 SURGERY — DILATION AND CURETTAGE
Anesthesia: General | Site: Vagina | Wound class: Clean Contaminated

## 2015-10-22 MED ORDER — SODIUM CHLORIDE 0.9 % IV BOLUS (SEPSIS)
1000.0000 mL | Freq: Once | INTRAVENOUS | Status: AC
  Administered 2015-10-22: 1000 mL via INTRAVENOUS

## 2015-10-22 MED ORDER — ACETAMINOPHEN 500 MG PO TABS
1000.0000 mg | ORAL_TABLET | Freq: Once | ORAL | Status: AC
  Administered 2015-10-22: 1000 mg via ORAL
  Filled 2015-10-22: qty 2

## 2015-10-22 MED ORDER — MIDAZOLAM HCL 2 MG/2ML IJ SOLN
INTRAMUSCULAR | Status: DC | PRN
  Administered 2015-10-22: 2 mg via INTRAVENOUS

## 2015-10-22 MED ORDER — GENTAMICIN SULFATE 40 MG/ML IJ SOLN
90.0000 mg | Freq: Three times a day (TID) | INTRAVENOUS | Status: DC
  Administered 2015-10-22 – 2015-10-24 (×5): 90 mg via INTRAVENOUS
  Filled 2015-10-22 (×8): qty 2.25

## 2015-10-22 MED ORDER — DEXTROSE 5 % IV SOLN
1.0000 g | Freq: Once | INTRAVENOUS | Status: AC
  Administered 2015-10-22: 1 g via INTRAVENOUS
  Filled 2015-10-22: qty 10

## 2015-10-22 MED ORDER — PROPOFOL 10 MG/ML IV BOLUS
INTRAVENOUS | Status: DC | PRN
  Administered 2015-10-22: 180 mg via INTRAVENOUS

## 2015-10-22 MED ORDER — GENTAMICIN SULFATE 40 MG/ML IJ SOLN
1.5000 mg/kg | Freq: Once | INTRAVENOUS | Status: AC
  Administered 2015-10-22: 90 mg via INTRAVENOUS
  Filled 2015-10-22 (×2): qty 2.25

## 2015-10-22 MED ORDER — FENTANYL CITRATE (PF) 100 MCG/2ML IJ SOLN
INTRAMUSCULAR | Status: AC
  Filled 2015-10-22: qty 2

## 2015-10-22 MED ORDER — KETOROLAC TROMETHAMINE 30 MG/ML IJ SOLN
30.0000 mg | Freq: Four times a day (QID) | INTRAMUSCULAR | Status: AC
  Administered 2015-10-22 – 2015-10-23 (×4): 30 mg via INTRAVENOUS
  Filled 2015-10-22 (×3): qty 1

## 2015-10-22 MED ORDER — CLINDAMYCIN PHOSPHATE 900 MG/50ML IV SOLN
900.0000 mg | Freq: Three times a day (TID) | INTRAVENOUS | Status: DC
  Administered 2015-10-22 – 2015-10-24 (×5): 900 mg via INTRAVENOUS
  Filled 2015-10-22 (×8): qty 50

## 2015-10-22 MED ORDER — SIMETHICONE 80 MG PO CHEW: 80.0000 mg | CHEWABLE_TABLET | Freq: Four times a day (QID) | ORAL | Status: DC | PRN

## 2015-10-22 MED ORDER — BISACODYL 10 MG RE SUPP: 10.0000 mg | Freq: Every day | RECTAL | Status: DC | PRN

## 2015-10-22 MED ORDER — ONDANSETRON HCL 4 MG/2ML IJ SOLN
INTRAMUSCULAR | Status: DC | PRN
  Administered 2015-10-22: 4 mg via INTRAVENOUS

## 2015-10-22 MED ORDER — SUCCINYLCHOLINE CHLORIDE 20 MG/ML IJ SOLN
INTRAMUSCULAR | Status: DC | PRN
  Administered 2015-10-22: 120 mg via INTRAVENOUS

## 2015-10-22 MED ORDER — HYDROMORPHONE HCL 1 MG/ML IJ SOLN
1.0000 mg | Freq: Once | INTRAMUSCULAR | Status: AC
  Administered 2015-10-22: 1 mg via INTRAVENOUS
  Filled 2015-10-22: qty 1

## 2015-10-22 MED ORDER — FENTANYL CITRATE (PF) 100 MCG/2ML IJ SOLN
INTRAMUSCULAR | Status: DC | PRN
  Administered 2015-10-22: 50 ug via INTRAVENOUS

## 2015-10-22 MED ORDER — LACTATED RINGERS IV SOLN: INTRAVENOUS | Status: DC

## 2015-10-22 MED ORDER — DOCUSATE SODIUM 100 MG PO CAPS
100.0000 mg | ORAL_CAPSULE | Freq: Two times a day (BID) | ORAL | Status: DC
  Administered 2015-10-22 – 2015-10-23 (×3): 100 mg via ORAL
  Filled 2015-10-22 (×3): qty 1

## 2015-10-22 MED ORDER — OXYCODONE-ACETAMINOPHEN 5-325 MG PO TABS
1.0000 | ORAL_TABLET | ORAL | Status: DC | PRN
  Administered 2015-10-23: 1 via ORAL
  Administered 2015-10-23: 2 via ORAL
  Administered 2015-10-24: 1 via ORAL
  Filled 2015-10-22: qty 2
  Filled 2015-10-22: qty 1
  Filled 2015-10-22: qty 2
  Filled 2015-10-22: qty 1

## 2015-10-22 MED ORDER — KETOROLAC TROMETHAMINE 30 MG/ML IJ SOLN
INTRAMUSCULAR | Status: DC | PRN
  Administered 2015-10-22: 30 mg via INTRAVENOUS

## 2015-10-22 MED ORDER — MORPHINE SULFATE (PF) 4 MG/ML IV SOLN
4.0000 mg | Freq: Once | INTRAVENOUS | Status: AC
  Administered 2015-10-22: 4 mg via INTRAVENOUS
  Filled 2015-10-22: qty 1

## 2015-10-22 MED ORDER — FENTANYL CITRATE (PF) 100 MCG/2ML IJ SOLN
25.0000 ug | INTRAMUSCULAR | Status: DC | PRN
  Administered 2015-10-22 (×2): 25 ug via INTRAVENOUS

## 2015-10-22 MED ORDER — ONDANSETRON HCL 4 MG/2ML IJ SOLN
4.0000 mg | Freq: Once | INTRAMUSCULAR | Status: AC
  Administered 2015-10-22: 4 mg via INTRAVENOUS
  Filled 2015-10-22: qty 2

## 2015-10-22 MED ORDER — LACTATED RINGERS IV SOLN
INTRAVENOUS | Status: DC
  Administered 2015-10-22 – 2015-10-23 (×2): via INTRAVENOUS

## 2015-10-22 MED ORDER — ONDANSETRON HCL 4 MG/2ML IJ SOLN: 4.0000 mg | Freq: Four times a day (QID) | INTRAMUSCULAR | Status: DC | PRN

## 2015-10-22 MED ORDER — OXYCODONE HCL 5 MG/5ML PO SOLN: 5.0000 mg | Freq: Once | ORAL | Status: DC | PRN

## 2015-10-22 MED ORDER — OXYCODONE HCL 5 MG PO TABS
5.0000 mg | ORAL_TABLET | Freq: Once | ORAL | Status: DC | PRN
Start: 2015-10-22 — End: 2015-10-22

## 2015-10-22 MED ORDER — LIDOCAINE HCL (CARDIAC) 20 MG/ML IV SOLN
INTRAVENOUS | Status: DC | PRN
  Administered 2015-10-22: 50 mg via INTRAVENOUS

## 2015-10-22 MED ORDER — LACTATED RINGERS IV SOLN
INTRAVENOUS | Status: DC | PRN
  Administered 2015-10-22: 16:00:00 via INTRAVENOUS

## 2015-10-22 MED ORDER — MORPHINE SULFATE (PF) 2 MG/ML IV SOLN
1.0000 mg | INTRAVENOUS | Status: DC | PRN
  Administered 2015-10-22: 2 mg via INTRAVENOUS
  Filled 2015-10-22: qty 1

## 2015-10-22 MED ORDER — DEXAMETHASONE SODIUM PHOSPHATE 10 MG/ML IJ SOLN
INTRAMUSCULAR | Status: DC | PRN
  Administered 2015-10-22: 10 mg via INTRAVENOUS

## 2015-10-22 MED ORDER — CLINDAMYCIN PHOSPHATE 600 MG/50ML IV SOLN
600.0000 mg | Freq: Once | INTRAVENOUS | Status: AC
  Administered 2015-10-22: 600 mg via INTRAVENOUS
  Filled 2015-10-22: qty 50

## 2015-10-22 MED ORDER — ONDANSETRON HCL 4 MG PO TABS: 4.0000 mg | ORAL_TABLET | Freq: Four times a day (QID) | ORAL | Status: DC | PRN

## 2015-10-22 MED ORDER — ACETAMINOPHEN 325 MG PO TABS: 650.0000 mg | ORAL_TABLET | ORAL | Status: DC | PRN

## 2015-10-22 SURGICAL SUPPLY — 14 items
BAG COUNTER SPONGE EZ (MISCELLANEOUS) IMPLANT
CATH ROBINSON RED A/P 16FR (CATHETERS) ×3 IMPLANT
COUNTER SPONGE BAG EZ (MISCELLANEOUS)
GLOVE BIO SURGEON STRL SZ8 (GLOVE) ×6 IMPLANT
GOWN STRL REUS W/ TWL LRG LVL3 (GOWN DISPOSABLE) ×1 IMPLANT
GOWN STRL REUS W/ TWL XL LVL3 (GOWN DISPOSABLE) ×1 IMPLANT
GOWN STRL REUS W/TWL LRG LVL3 (GOWN DISPOSABLE) ×2
GOWN STRL REUS W/TWL XL LVL3 (GOWN DISPOSABLE) ×2
HANDLE YANKAUER SUCT BULB TIP (MISCELLANEOUS) ×3 IMPLANT
KIT RM TURNOVER CYSTO AR (KITS) ×3 IMPLANT
PACK DNC HYST (MISCELLANEOUS) ×3 IMPLANT
PAD OB MATERNITY 4.3X12.25 (PERSONAL CARE ITEMS) ×3 IMPLANT
PAD PREP 24X41 OB/GYN DISP (PERSONAL CARE ITEMS) ×3 IMPLANT
TOWEL OR 17X26 4PK STRL BLUE (TOWEL DISPOSABLE) ×3 IMPLANT

## 2015-10-22 NOTE — Op Note (Signed)
Operative Note  10/22/2015  PRE-OP DIAGNOSIS: Postpartum Endomyometritis  POST-OP DIAGNOSIS: same   SURGEON: Barnett Applebaum, MD, FACOG  PROCEDURE: Procedure(s): DILATATION AND CURETTAGE   ANESTHESIA: Choice   ESTIMATED BLOOD LOSS: 150 mL, from uterus  SPECIMENS: EMC  COMPLICATIONS: None  DISPOSITION: PACU - hemodynamically stable.  CONDITION: stable  FINDINGS: Exam under anesthesia revealed 12 week size, mobile  uterus with no masses and bilateral adnexa without masses or fullness.   PROCEDURE IN DETAIL: After informed consent was obtained, the patient was taken to the operating room where anesthesia was obtained without difficulty. The patient was positioned in the dorsal lithotomy position in [place]. The patient's bladder was catheterized with an in and out foley catheter. The patient was examined under anesthesia, with the above noted findings. The weighted speculum was placed inside the patient's vagina, and the the anterior lip of the cervix was seen and grasped with the tenaculum.  The uterine cavity was sounded to 15 cm, and then the cervix was already dilated. The uterine cavity was curetted until a gritty texture was noted, yielding specimen for endometrial curettings. Excellent hemostasis was noted, and all instruments were removed, with excellent hemostasis noted throughout. She was then taken out of dorsal lithotomy. The patient tolerated the procedure well. Sponge, lap and needle counts were correct x2. The patient was taken to recovery room in excellent condition.

## 2015-10-22 NOTE — Anesthesia Procedure Notes (Signed)
Procedure Name: Intubation Date/Time: 10/22/2015 4:01 PM Performed by: Johnna Acosta Pre-anesthesia Checklist: Patient identified, Emergency Drugs available, Suction available, Patient being monitored and Timeout performed Patient Re-evaluated:Patient Re-evaluated prior to inductionOxygen Delivery Method: Circle system utilized Preoxygenation: Pre-oxygenation with 100% oxygen Intubation Type: IV induction Ventilation: Mask ventilation without difficulty Laryngoscope Size: Miller and 2 Grade View: Grade II Tube type: Oral Tube size: 7.0 mm Number of attempts: 1 Airway Equipment and Method: Stylet Placement Confirmation: ETT inserted through vocal cords under direct vision,  positive ETCO2 and breath sounds checked- equal and bilateral Secured at: 20 cm Tube secured with: Tape Dental Injury: Teeth and Oropharynx as per pre-operative assessment

## 2015-10-22 NOTE — Anesthesia Postprocedure Evaluation (Signed)
Anesthesia Post Note  Patient: Lindsey Cain  Procedure(s) Performed: Procedure(s) (LRB): DILATATION AND CURETTAGE (N/A)  Patient location during evaluation: PACU Anesthesia Type: General Level of consciousness: awake and alert Pain management: pain level controlled Vital Signs Assessment: post-procedure vital signs reviewed and stable Respiratory status: spontaneous breathing, nonlabored ventilation, respiratory function stable and patient connected to nasal cannula oxygen Cardiovascular status: blood pressure returned to baseline and stable Postop Assessment: no signs of nausea or vomiting Anesthetic complications: no    Last Vitals:  Vitals:   10/22/15 1730 10/22/15 1745  BP: 115/73 110/70  Pulse: 87 82  Resp: 18 16  Temp:  37.3 C    Last Pain:  Vitals:   10/22/15 1745  TempSrc:   PainSc: 4                  Precious Haws Yuritzi Kamp

## 2015-10-22 NOTE — ED Triage Notes (Addendum)
Pt c/o "i have a real bad headache and my body hurts all over" with N/v since last night.. Pt states she is postpartum 2 weeks and had an epidural.

## 2015-10-22 NOTE — ED Notes (Signed)
Patient transported to Ultrasound 

## 2015-10-22 NOTE — H&P (Signed)
Obstetrics & Gynecology H&P Note  Date of Consultation: 10/22/2015   Requesting Provider: [facility];    Primary OBGYN: Jola Babinski Primary Care Provider: [facility] Department  Reason for Consultation: Pain  History of Present Illness: Lindsey Cain is a 30 y.o. [phone] (Patient's last menstrual period was 01/16/2015 (lmp unknown).), with the above CC. Delivered 12 days ago, uneventful NSVD and placenta delivery.  Recent onset pain and fever.  Lochia the same.  Never had this problem before. Received DEPO prior to discharge from hospital 10 days ago.  ROS: A 12-point review of systems was performed and negative, except as stated in the above HPI.  OBGYN History: As per HPI. OB History    Gravida Para Term Preterm AB Living   6 4 4  0 2 4   SAB TAB Ectopic Multiple Live Births   1 1 0 0 4        Past Medical History: Past Medical History:  Diagnosis Date  . Anemia   . Asthma   . Herpes genitalis     Past Surgical History: History reviewed. No pertinent surgical history.  Family History:  Family History  Problem Relation Age of Onset  . Diabetes Neg Hx    She denies any female cancers, bleeding or blood clotting disorders.   Social History:  Social History   Social History  . Marital status: Single    Spouse name: N/A  . Number of children: N/A  . Years of education: N/A   Occupational History  . Not on file.   Social History Main Topics  . Smoking status: Never Smoker  . Smokeless tobacco: Never Used  . Alcohol use No  . Drug use: No  . Sexual activity: No   Other Topics Concern  . Not on file   Social History Narrative  . No narrative on file   Allergy: No Known Allergies  Current Outpatient Medications:  (Not in a hospital admission)   Hospital Medications: Current Facility-Administered Medications  Medication Dose Route Frequency Provider Last Rate Last Dose  . clindamycin (CLEOCIN) IVPB 600 mg  600 mg Intravenous Once Schuyler Amor, MD 100 mL/hr at 10/22/15 1218 600 mg at 10/22/15 1218  . gentamicin (GARAMYCIN) 90 mg in dextrose 5 % 50 mL IVPB  1.5 mg/kg Intravenous Once Schuyler Amor, MD       Current Outpatient Prescriptions  Medication Sig Dispense Refill  . ferrous sulfate 325 (65 FE) MG tablet Take 325 mg by mouth 3 (three) times daily with meals.    Marland Kitchen ibuprofen (ADVIL,MOTRIN) 600 MG tablet Take 1 tablet (600 mg total) by mouth every 6 (six) hours. 30 tablet 0  . Prenatal Multivit-Min-Fe-FA (PRENATAL VITAMINS) 0.8 MG tablet Take 1 tablet by mouth daily. 30 tablet 3  . medroxyPROGESTERone (DEPO-PROVERA) 150 MG/ML injection Inject 1 mL (150 mg total) into the muscle once. 1 mL 3     Physical Exam: Vitals:   10/22/15 0930 10/22/15 1100 10/22/15 1145 10/22/15 1146  BP: 136/83     Pulse:  (!) 114 (!) 108   Resp: (!) 33 18 15   Temp:    (!) 103 F (39.4 C)  TempSrc:    Oral  SpO2:  100% 100%   Weight:      Height:        Temp:  [99.6 F (37.6 C)-103 F (39.4 C)] 103 F (39.4 C) (08/16 1146) Pulse Rate:  [108-128] 108 (08/16 1145) Resp:  [12-33] 15 (08/16 1145)  BP: (124-136)/(78-86) 136/83 (08/16 0930) SpO2:  [98 %-100 %] 100 % (08/16 1145) Weight:  [136 lb (61.7 kg)] 136 lb (61.7 kg) (08/16 0821) No intake/output data recorded. No intake/output data recorded. No intake or output data in the 24 hours ending 10/22/15 1226   Current Vital Signs 24h Vital Sign Ranges  T (!) 103 F (39.4 C) Temp  Avg: 101.3 F (38.5 C)  Min: 99.6 F (37.6 C)  Max: 103 F (39.4 C)  BP 136/83 BP  Min: 124/82  Max: 136/83  HR (!) 108 Pulse  Avg: 118  Min: 108  Max: 128  RR 15 Resp  Avg: 19.9  Min: 12  Max: 33  SaO2 100 % Not Delivered SpO2  Avg: 99.7 %  Min: 98 %  Max: 100 %       24 Hour I/O Current Shift I/O  Time Ins Outs No intake/output data recorded. No intake/output data recorded.   Patient Vitals for the past 8 hrs:  BP Temp Temp src Pulse Resp SpO2 Height Weight  10/22/15 1146 - (!) 103 F  (39.4 C) Oral - - - - -  10/22/15 1145 - - - (!) 108 15 100 % - -  10/22/15 1100 - - - (!) 114 18 100 % - -  10/22/15 0930 136/83 - - - (!) 33 - - -  10/22/15 0900 135/78 - - (!) 128 16 100 % - -  10/22/15 0845 - - - (!) 119 12 100 % - -  10/22/15 0832 124/82 - - (!) 116 (!) 23 98 % - -  10/22/15 0821 131/86 99.6 F (37.6 C) Oral (!) 123 (!) 22 100 % 5\' 6"  (1.676 m) 136 lb (61.7 kg)    Body mass index is 21.95 kg/m. General appearance: Well nourished, well developed female in no acute distress.  Neck:  Supple, normal appearance, and no thyromegaly  Cardiovascular:Regular rate and rhythm.  No murmurs, rubs or gallops. Respiratory:  Clear to auscultation bilateral. Normal respiratory effort Abdomen: positive bowel sounds and no masses, hernias; diffusely non tender to palpation, non distended Neuro/Psych:  Normal mood and affect.  Skin:  Warm and dry.  Lymphatic:  No inguinal lymphadenopathy.   Pelvic exam: is not limited by body habitus EGBUS: within normal limits, Vagina: within normal limits and with some blood in the vault, Cervix:  normal, Uterus:  Boggy tender and approximately 12 week sized, and Adnexa:  Not examined   Recent Labs Lab 10/22/15 0843  NA 131*  K 3.2*  CL 104  CO2 21*  BUN 8  CREATININE 0.59  CALCIUM 8.8*  PROT 7.9  BILITOT 1.1  ALKPHOS 115  ALT 17  AST 27  GLUCOSE 114*   No results for input(s): APTT, INR, PTT in the last 168 hours.  Invalid input(s): DRHAPTT No results for input(s): ABORH in the last 168 hours.  Imaging:  CLINICAL DATA:  Postpartum pelvic pain  EXAM: TRANSABDOMINAL ULTRASOUND OF PELVIS  TECHNIQUE: Transabdominal ultrasound examination of the pelvis was performed including evaluation of the uterus, ovaries, adnexal regions, and pelvic cul-de-sac.  COMPARISON:  None.  FINDINGS: Uterus  Measurements: 12.2 x 7.1 x 8.8 cm. No fibroids or other mass visualized.  Endometrium  Thickness: Increased in thickness  to 5.5 cm. Some slight increased vascularity is noted surrounding the heterogeneous endometrium. This raises suspicion for retained products of conception. Clinical correlation is recommended.  Right ovary  Measurements: 2.3 x 1.2 x 2.3 cm. Normal appearance/no adnexal mass.  Left ovary  Measurements: 2.4 x 1.1 x 1.5 cm. Normal appearance/no adnexal mass.  Other findings:  No abnormal free fluid.  IMPRESSION: Prominent endometrium with increased vascularity suspicious for retained products of conception.   Electronically Signed   By: Inez Catalina M.D.   On: 10/22/2015 11:11  Assessment: Ms. Oros is a 30 y.o. [id number] who presented to the ED with complaints of pain and fever; findings are consistent with endomymetritis and retained POC.  Plan: ABX D&C Monitor until without fever and min pain  Pros and cons of surgery discussed.  Barnett Applebaum, MD [facility] OBGYN Pager [phone]

## 2015-10-22 NOTE — Transfer of Care (Signed)
Immediate Anesthesia Transfer of Care Note  Patient: Lindsey Cain  Procedure(s) Performed: Procedure(s): DILATATION AND CURETTAGE (N/A)  Patient Location: PACU  Anesthesia Type:General  Level of Consciousness: sedated  Airway & Oxygen Therapy: Patient Spontanous Breathing and Patient connected to face mask oxygen  Post-op Assessment: Report given to RN and Post -op Vital signs reviewed and stable  Post vital signs: Reviewed and stable  Last Vitals:  Vitals:   10/22/15 1500 10/22/15 1515  BP: 109/71   Pulse: 85 88  Resp: 19 19  Temp:      Last Pain:  Vitals:   10/22/15 1258  TempSrc: Oral  PainSc:          Complications: No apparent anesthesia complications

## 2015-10-22 NOTE — Anesthesia Preprocedure Evaluation (Signed)
Anesthesia Evaluation  Patient identified by MRN, date of birth, ID band Patient awake    Reviewed: Allergy & Precautions, H&P , NPO status , Patient's Chart, lab work & pertinent test results  History of Anesthesia Complications Negative for: history of anesthetic complications  Airway Mallampati: II  TM Distance: >3 FB Neck ROM: full    Dental no notable dental hx. (+) Poor Dentition, Chipped   Pulmonary neg shortness of breath, asthma ,    Pulmonary exam normal breath sounds clear to auscultation       Cardiovascular Exercise Tolerance: Good (-) angina(-) Past MI and (-) DOE negative cardio ROS Normal cardiovascular exam Rhythm:regular Rate:Normal     Neuro/Psych negative neurological ROS  negative psych ROS   GI/Hepatic negative GI ROS, Neg liver ROS,   Endo/Other  negative endocrine ROS  Renal/GU      Musculoskeletal   Abdominal   Peds  Hematology negative hematology ROS (+)   Anesthesia Other Findings Past Medical History: No date: Anemia No date: Asthma 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  History reviewed. No pertinent surgical history.  BMI    Body Mass Index:  21.95 kg/m      Reproductive/Obstetrics                             Anesthesia Physical Anesthesia Plan  ASA: III  Anesthesia Plan: General ETT   Post-op Pain Management:    Induction:   Airway Management Planned:   Additional Equipment:   Intra-op Plan:   Post-operative Plan:   Informed Consent: I have reviewed the patients History and Physical, chart, labs and discussed the procedure including the risks, benefits and alternatives for the proposed anesthesia with the patient or authorized representative who has indicated his/her understanding and acceptance.     Plan Discussed with: Anesthesiologist, CRNA and Surgeon  Anesthesia Plan Comments:         Anesthesia Quick Evaluation

## 2015-10-22 NOTE — ED Provider Notes (Addendum)
[facility] Emergency Department Provider Note  ____________________________________________   I have reviewed the triage vital signs and the nursing notes.   HISTORY  Chief Complaint Headache and Emesis    HPI Lindsey Cain is a 30 y.o. female who presents today complaining of "my whole body hurts". Patient gave birth on the second to vaginal delivery. Patient states that since that time she's had suprapubic cramping which got acutely worse last night. She's had some nausea. She states that her whole body began to hurt. She denies checking fever but she felt a little bit warm. She did mention headache to the triage nurse when asked but she denies it to me she states "I hope body hurts". It seems as if the major problem for her's or suprapubic region. She denies dysuria or urinary frequency. She is not breast-feeding. She denies diarrhea or melena. She's had normal bowel movements. She is not having any further lochia. She denies vaginal discharge.    Past Medical History:  Diagnosis Date  . Anemia   . Asthma   . Herpes genitalis     Patient Active Problem List   Diagnosis Date Noted  . Postpartum care following vaginal delivery 10/08/2015  . Back pain affecting pregnancy in second trimester 10/08/2015  . Iron deficiency anemia 08/28/2015    History reviewed. No pertinent surgical history.  Current Outpatient Rx  . Order #: [id number] Class: Historical Med  . Order #: [id number] Class: Print  . Order #: [id number] Class: Print  . Order #: [id number] Class: Normal    Allergies Review of patient's allergies indicates no known allergies.  Family History  Problem Relation Age of Onset  . Diabetes Neg Hx     Social History Social History  Substance Use Topics  . Smoking status: Never Smoker  . Smokeless tobacco: Never Used  . Alcohol use No    Review of Systems Constitutional: No fever/chills Eyes: No visual changes. ENT: No sore throat. No  stiff neck no neck pain Cardiovascular: Denies chest pain. Respiratory: Denies shortness of breath. Gastrointestinal:  Positive nausea  No diarrhea.  No constipation. Genitourinary: Negative for dysuria. Musculoskeletal: Negative lower extremity swelling Skin: Negative for rash. Neurological: Negative for severe headaches, focal weakness or numbness. 10-point ROS otherwise negative.  ____________________________________________   PHYSICAL EXAM:  VITAL SIGNS: ED Triage Vitals [10/22/15 0821]  Enc Vitals Group     BP 131/86     Pulse Rate (!) 123     Resp (!) 22     Temp 99.6 F (37.6 C)     Temp Source Oral     SpO2 100 %     Weight 136 lb (61.7 kg)     Height 5\' 6"  (1.676 m)     Head Circumference      Peak Flow      Pain Score 10     Pain Loc      Pain Edu?      Excl. in [place]?     Constitutional: Alert and oriented. Well appearing and in no acute distress.She is anxious Eyes: Conjunctivae are normal. PERRL. EOMI. Head: Atraumatic. Nose: No congestion/rhinnorhea. Mouth/Throat: Mucous membranes are moist.  Oropharynx non-erythematous. Neck: No stridor.   Nontender with no meningismus Cardiovascular: Tachycardia noted, regular rhythm. Grossly normal heart sounds.  Good peripheral circulation. Respiratory: Normal respiratory effort.  No retractions. Lungs CTAB. Abdominal: Soft and positive suprapubic tenderness noted. No distention. No guarding no rebound nonsurgical abdomen Back:  There is no focal tenderness or step  off.  there is no midline tenderness there are no lesions noted. there is no CVA tenderness Musculoskeletal: No lower extremity tenderness, no upper extremity tenderness. No joint effusions, no DVT signs strong distal pulses no edema Neurologic:  Normal speech and language. No gross focal neurologic deficits are appreciated.  Skin:  Skin is warm, dry and intact. No rash noted. Psychiatric: Mood and affect are normal. Speech and behavior are  normal.  ____________________________________________   LABS (all labs ordered are listed, but only abnormal results are displayed)  Labs Reviewed  COMPREHENSIVE METABOLIC PANEL - Abnormal; Notable for the following:       Result Value   Sodium 131 (*)    Potassium 3.2 (*)    CO2 21 (*)    Glucose, Bld 114 (*)    Calcium 8.8 (*)    All other components within normal limits  CBC - Abnormal; Notable for the following:    WBC 13.4 (*)    RBC 5.34 (*)    RDW 17.2 (*)    Platelets 135 (*)    All other components within normal limits  URINALYSIS COMPLETEWITH MICROSCOPIC ([facility] ONLY) - Abnormal; Notable for the following:    Color, Urine YELLOW (*)    APPearance CLEAR (*)    Hgb urine dipstick 2+ (*)    Leukocytes, UA 3+ (*)    Bacteria, UA RARE (*)    Squamous Epithelial / LPF 0-5 (*)    All other components within normal limits  PREGNANCY, URINE - Abnormal; Notable for the following:    Preg Test, Ur POSITIVE (*)    All other components within normal limits  HCG, QUANTITATIVE, PREGNANCY - Abnormal; Notable for the following:    hCG, Beta Chain, Quant, S 34 (*)    All other components within normal limits  URINE CULTURE  CHLAMYDIA/NGC RT PCR ([facility] ONLY)  WET PREP, GENITAL  CULTURE, BLOOD (ROUTINE X 2)  CULTURE, BLOOD (ROUTINE X 2)  LIPASE, BLOOD   ____________________________________________  EKG  I personally interpreted any EKGs ordered by me or triage  ____________________________________________  RADIOLOGY  I reviewed any imaging ordered by me or triage that were performed during my shift and, if possible, patient and/or family made aware of any abnormal findings. ____________________________________________   PROCEDURES  Procedure(s) performed: None  Procedures  Critical Care performed: None  ____________________________________________   INITIAL IMPRESSION / ASSESSMENT AND PLAN / ED COURSE  Pertinent labs & imaging results that were  available during my care of the patient were reviewed by me and considered in my medical decision making (see chart for details).  Patient with suprapubic discomfort after pregnancy presents today complaining of allover body pain since yesterday. Concern exists for endometritis. Also urinary tract infection. Initially, patient was afebrile. She has slight tachycardia but she was quite anxious. After pain medication, patient is more comfortable. Ultrasound shows same product of conception.   ,Pelvic exam: Female nurse chaperone present, no external lesions noted, purulent vaginal discharge noted with CMT, , no adnexal tenderness or mass, there is significant uterine tenderness no mass. No vaginal bleeding  Patient then became febrile upon return from pelvic exam. We have given her Tylenol, I initially treated her empirically for UTI with Rocephin. We will advance her antibiotic coverage to cover her for endometritis, and I have paged [facility]. We will give her IV fluids. Heart rate is in the 120s at this time with a fever 103 we'll continue aggressive hydration.   ----------------------------------------- 12:02 PM on  10/22/2015 -----------------------------------------  Dr. Kenton Kingfisher of OB/GYN does agree with management. He did ask me to give her clindamycin & gentamicin which we are doing. He will come see the patient. We are giving her aggressive hydration. We'll send a lactate and blood cultures as a precaution.    Clinical Course   ____________________________________________   FINAL CLINICAL IMPRESSION(S) / ED DIAGNOSES  Final diagnoses:  Female pelvic pain      This chart was dictated using voice recognition software.  Despite best efforts to proofread,  errors can occur which can change meaning.       Schuyler Amor, MD 10/22/15 [place], MD 10/22/15 [phone]

## 2015-10-23 ENCOUNTER — Encounter: Payer: Self-pay | Admitting: Obstetrics & Gynecology

## 2015-10-23 LAB — URINE CULTURE

## 2015-10-23 LAB — CBC WITH DIFFERENTIAL/PLATELET
Basophils Absolute: 0 10*3/uL (ref 0–0.1)
Basophils Relative: 0 %
EOS ABS: 0 10*3/uL (ref 0–0.7)
EOS PCT: 0 %
HCT: 40 % (ref 35.0–47.0)
Hemoglobin: 13.3 g/dL (ref 12.0–16.0)
LYMPHS ABS: 0.6 10*3/uL — AB (ref 1.0–3.6)
Lymphocytes Relative: 3 %
MCH: 29.3 pg (ref 26.0–34.0)
MCHC: 33.1 g/dL (ref 32.0–36.0)
MCV: 88.5 fL (ref 80.0–100.0)
MONO ABS: 0.5 10*3/uL (ref 0.2–0.9)
MONOS PCT: 3 %
Neutro Abs: 17.3 10*3/uL — ABNORMAL HIGH (ref 1.4–6.5)
Neutrophils Relative %: 94 %
PLATELETS: 122 10*3/uL — AB (ref 150–440)
RBC: 4.52 MIL/uL (ref 3.80–5.20)
RDW: 17 % — AB (ref 11.5–14.5)
WBC: 18.5 10*3/uL — AB (ref 3.6–11.0)

## 2015-10-23 MED ORDER — IBUPROFEN 600 MG PO TABS
600.0000 mg | ORAL_TABLET | Freq: Four times a day (QID) | ORAL | Status: DC | PRN
  Administered 2015-10-23 – 2015-10-24 (×2): 600 mg via ORAL
  Filled 2015-10-23 (×2): qty 1

## 2015-10-23 NOTE — Progress Notes (Signed)
Administrative coordinator notified to assist with IV access

## 2015-10-23 NOTE — Progress Notes (Signed)
Attempted IV once, patient states there had been multiple attempts. Patient stated she did not want anyone else to try.  Informed RN

## 2015-10-23 NOTE — Progress Notes (Signed)
Subjective: Patient reports improved pain from yesterday and no further fever or chills as she had yesterday prior to admission.  Bleeding has lessened this am.    Objective: I have reviewed patient's vital signs, intake and output, medications and labs. AF- no fever; VSS Abd: Min T, ND, fundus firm Extr: no calf T, no edema  Labs- WBC 18    Cultures pending    Pathology pending  Assessment:  Postpartum Endomyometritis s/p Procedure(s): DILATATION AND CURETTAGE (N/A): stable  Plan: Continue ABX (IV) as she recovers from PP Endometritis s/p D&C, as WBC still elevated; but overall she feels better.  Anticpate discharge tomorrow.  LOS: 0 days   Hoyt Koch 10/23/2015, 8:54 AM

## 2015-10-24 LAB — CBC WITH DIFFERENTIAL/PLATELET
BASOS ABS: 0 10*3/uL (ref 0–0.1)
Basophils Relative: 0 %
EOS ABS: 0.1 10*3/uL (ref 0–0.7)
EOS PCT: 1 %
HCT: 35.1 % (ref 35.0–47.0)
Hemoglobin: 11.8 g/dL — ABNORMAL LOW (ref 12.0–16.0)
LYMPHS PCT: 19 %
Lymphs Abs: 2.1 10*3/uL (ref 1.0–3.6)
MCH: 29.4 pg (ref 26.0–34.0)
MCHC: 33.5 g/dL (ref 32.0–36.0)
MCV: 87.6 fL (ref 80.0–100.0)
MONO ABS: 0.7 10*3/uL (ref 0.2–0.9)
Monocytes Relative: 6 %
Neutro Abs: 8 10*3/uL — ABNORMAL HIGH (ref 1.4–6.5)
Neutrophils Relative %: 74 %
PLATELETS: 119 10*3/uL — AB (ref 150–440)
RBC: 4 MIL/uL (ref 3.80–5.20)
RDW: 17.2 % — AB (ref 11.5–14.5)
WBC: 10.8 10*3/uL (ref 3.6–11.0)

## 2015-10-24 LAB — SURGICAL PATHOLOGY

## 2015-10-24 MED ORDER — HYDROCODONE-ACETAMINOPHEN 5-325 MG PO TABS: 1.0000 | ORAL_TABLET | Freq: Four times a day (QID) | ORAL | 0 refills | Status: DC | PRN

## 2015-10-24 NOTE — Progress Notes (Signed)
Patient discharged home. Discharge instructions, prescriptions and follow up appointment given to and reviewed with patient. Patient verbalized understanding. Escorted out via wheelchair by [employer].

## 2015-10-24 NOTE — Discharge Summary (Addendum)
DC Summary Discharge Summary   Patient ID: Lindsey Cain [id number] 30 y.o. 1985/10/01  Admit date: 10/22/2015  Discharge date: 10/24/2015  Principal Diagnoses:  Postpartum endomyometritis  Secondary Diagnoses:  none  Procedures performed during the hospitalization:  1) dilation and curettage 2) administration of IV antibiotics for endomyometritis  HPI: Ms. Lindsey Cain is a 30 y.o. [id number] (Patient's last menstrual period was 01/16/2015 (lmp unknown).), with the above CC. Delivered 12 days ago, uneventful NSVD and placenta delivery.  Recent onset pain and fever.  Lochia the same.  Never had this problem before. Received DEPO prior to discharge from hospital 10 days ago.  Past Medical History:  Diagnosis Date  . Anemia   . Asthma   . Herpes genitalis     Past Surgical History:  Procedure Laterality Date  . DILATION AND CURETTAGE OF UTERUS N/A 10/22/2015   Procedure: DILATATION AND CURETTAGE;  Surgeon: Gae Dry, MD;  Location: [facility] ORS;  Service: Gynecology;  Laterality: N/A;   Allergies: No Known Allergies  Social History  Substance Use Topics  . Smoking status: Never Smoker  . Smokeless tobacco: Never Used  . Alcohol use No    Family History  Problem Relation Age of Onset  . Diabetes Neg Hx     Hospital Course:  Admitted for fever and obvious infection. Taken to OR for dilation and curettage which occurred without incident. She was placed on IV antibiotics (gentamycin and clindamycin) per CDC recommendations.  She remained afebrile for 24 hours and her exam was benign. She had no pain at the time of discharge.  Discharge Exam: BP 108/69 (BP Location: Right Arm)   Pulse 65   Temp 98.6 F (37 C) (Oral)   Resp 20   Ht 5\' 6"  (1.676 m)   Wt 61.7 kg (136 lb)   LMP 01/16/2015 (LMP Unknown)   SpO2 100%   BMI 21.95 kg/m  General  no apparent distress   CV  RRR   Pulmonary  clear to ausculatation bllaterally   Abdomen  Bowel sounds: present, Fundus firm at  U-3 and nontender  Extremities  no edema, symmetric, SCDs in place    Condition at Discharge: Stable  Complications affecting treatment: None  Discharge Medications:    Medication List    TAKE these medications   ferrous sulfate 325 (65 FE) MG tablet Take 325 mg by mouth 3 (three) times daily with meals.   HYDROcodone-acetaminophen 5-325 MG tablet Commonly known as:  NORCO Take 1 tablet by mouth every 6 (six) hours as needed for moderate pain.   ibuprofen 600 MG tablet Commonly known as:  ADVIL,MOTRIN Take 1 tablet (600 mg total) by mouth every 6 (six) hours.   medroxyPROGESTERone 150 MG/ML injection Commonly known as:  DEPO-PROVERA Inject 1 mL (150 mg total) into the muscle once.   Prenatal Vitamins 0.8 MG tablet Take 1 tablet by mouth daily.       Follow-up Information    Hoyt Koch, MD. Go on 10/31/2015.   Specialty:  Obstetrics and Gynecology Why:  Post op and fever follow up on Friday October 31, 2015 at 2:40 PM. Contact information: [place] [address] [phone]          Discharge Disposition: Home to self care  Signed: Will Bonnet, MD 10/24/2015 11:20 AM

## 2015-11-02 NOTE — Progress Notes (Deleted)
[place]  [phone] [phone]  ID: Twana First OB: Jun 08, 1985  MR#: [id number]  [id number]  Patient Care Team: [facility] Department as PCP - General  CHIEF COMPLAINT: Iron deficiency anemia.  INTERVAL HISTORY: Patient is a 30 year old female who was found to have a declining hemoglobin and the third trimester pregnancy and routine blood work. She currently feels well and is asymptomatic. She is gaining weight appropriately. She has no neurologic complaints. She denies any recent fevers. She does not complain of weakness or fatigue. She has no chest pain or shortness of breath. She denies any nausea, vomiting, constipation, or diarrhea. She has no melena or hematochezia. She has no urinary complaints. Patient offers no specific complaints today.   REVIEW OF SYSTEMS:   Review of Systems  Constitutional: Negative.  Negative for fever, malaise/fatigue and weight loss.  Respiratory: Negative.  Negative for cough and shortness of breath.   Cardiovascular: Negative.  Negative for chest pain.  Gastrointestinal: Negative.  Negative for blood in stool and melena.  Musculoskeletal: Negative.   Neurological: Negative.  Negative for weakness.  Psychiatric/Behavioral: Negative.     As per HPI. Otherwise, a complete review of systems is negatve.  PAST MEDICAL HISTORY: Past Medical History:  Diagnosis Date  . Anemia   . Asthma   . Herpes genitalis     PAST SURGICAL HISTORY: Past Surgical History:  Procedure Laterality Date  . DILATION AND CURETTAGE OF UTERUS N/A 10/22/2015   Procedure: DILATATION AND CURETTAGE;  Surgeon: Gae Dry, MD;  Location: [facility] ORS;  Service: Gynecology;  Laterality: N/A;    FAMILY HISTORY Family History  Problem Relation Age of Onset  . Diabetes Neg Hx        ADVANCED DIRECTIVES:    HEALTH MAINTENANCE: Social History  Substance Use Topics  . Smoking status: Never Smoker  .  Smokeless tobacco: Never Used  . Alcohol use No     Colonoscopy:  PAP:  Bone density:  Lipid panel:  No Known Allergies  Current Outpatient Prescriptions  Medication Sig Dispense Refill  . ferrous sulfate 325 (65 FE) MG tablet Take 325 mg by mouth 3 (three) times daily with meals.    Marland Kitchen HYDROcodone-acetaminophen (NORCO) 5-325 MG tablet Take 1 tablet by mouth every 6 (six) hours as needed for moderate pain. 20 tablet 0  . ibuprofen (ADVIL,MOTRIN) 600 MG tablet Take 1 tablet (600 mg total) by mouth every 6 (six) hours. 30 tablet 0  . medroxyPROGESTERone (DEPO-PROVERA) 150 MG/ML injection Inject 1 mL (150 mg total) into the muscle once. 1 mL 3  . Prenatal Multivit-Min-Fe-FA (PRENATAL VITAMINS) 0.8 MG tablet Take 1 tablet by mouth daily. 30 tablet 3   No current facility-administered medications for this visit.     OBJECTIVE: There were no vitals filed for this visit.   There is no height or weight on file to calculate BMI.    ECOG FS:0 - Asymptomatic  General: Well-developed, well-nourished, no acute distress. Eyes: Pink conjunctiva, anicteric sclera. HEENT: Normocephalic, moist mucous membranes, clear oropharnyx. Lungs: Clear to auscultation bilaterally. Heart: Regular rate and rhythm. No rubs, murmurs, or gallops. Abdomen: Appears appropriate for gestational age. Musculoskeletal: No edema, cyanosis, or clubbing. Neuro: Alert, answering all questions appropriately. Cranial nerves grossly intact. Skin: No rashes or petechiae noted. Psych: Normal affect. Lymphatics: No cervical, calvicular, axillary or inguinal LAD.   LAB RESULTS:  Lab Results  Component Value Date   NA 131 (L) 10/22/2015   K 3.2 (  L) 10/22/2015   CL 104 10/22/2015   CO2 21 (L) 10/22/2015   GLUCOSE 114 (H) 10/22/2015   BUN 8 10/22/2015   CREATININE 0.59 10/22/2015   CALCIUM 8.8 (L) 10/22/2015   PROT 7.9 10/22/2015   ALBUMIN 3.5 10/22/2015   AST 27 10/22/2015   ALT 17 10/22/2015   ALKPHOS 115 10/22/2015    BILITOT 1.1 10/22/2015   GFRNONAA >60 10/22/2015   GFRAA >60 10/22/2015    Lab Results  Component Value Date   WBC 10.8 10/24/2015   NEUTROABS 8.0 (H) 10/24/2015   HGB 11.8 (L) 10/24/2015   HCT 35.1 10/24/2015   MCV 87.6 10/24/2015   PLT 119 (L) 10/24/2015   Lab Results  Component Value Date   IRON 41 08/18/2015   TIBC 513 (H) 08/18/2015   IRONPCTSAT 8 (L) 08/18/2015    Lab Results  Component Value Date   FERRITIN 13 08/18/2015    STUDIES: US Pelvis Complete  Result Date: 10/22/2015 CLINICAL DATA:  Postpartum pelvic pain EXAM: TRANSABDOMINAL ULTRASOUND OF PELVIS TECHNIQUE: Transabdominal ultrasound examination of the pelvis was performed including evaluation of the uterus, ovaries, adnexal regions, and pelvic cul-de-sac. COMPARISON:  None. FINDINGS: Uterus Measurements: 12.2 x 7.1 x 8.8 cm. No fibroids or other mass visualized. Endometrium Thickness: Increased in thickness to 5.5 cm. Some slight increased vascularity is noted surrounding the heterogeneous endometrium. This raises suspicion for retained products of conception. Clinical correlation is recommended. Right ovary Measurements: 2.3 x 1.2 x 2.3 cm. Normal appearance/no adnexal mass. Left ovary Measurements: 2.4 x 1.1 x 1.5 cm. Normal appearance/no adnexal mass. Other findings:  No abnormal free fluid. IMPRESSION: Prominent endometrium with increased vascularity suspicious for retained products of conception. Electronically Signed   By: Inez Catalina M.D.   On: 10/22/2015 11:11    ASSESSMENT: Iron deficiency anemia in pregnancy.  PLAN:    1. Iron deficiency anemia in pregnancy: Patient's hemoglobin and iron stores are found to be significantly decreased and she will benefit from IV iron. Return to clinic later this week and then again in 2 weeks for consideration of 510 mg of IV iron. Patient will then return to clinic the first week of August for repeat laboratory work, further evaluation, and consideration of  additional IV iron. 2. Pregnancy: Patient reports her due date is October 18, 2015.  Patient expressed understanding and was in agreement with this plan. She also understands that She can call clinic at any time with any questions, concerns, or complaints.     Lloyd Huger, MD   11/02/2015 11:52 PM

## 2015-11-03 ENCOUNTER — Inpatient Hospital Stay: Payer: Medicaid Other

## 2015-11-03 ENCOUNTER — Inpatient Hospital Stay: Payer: Medicaid Other | Admitting: Oncology

## 2016-04-23 ENCOUNTER — Encounter: Payer: Self-pay | Admitting: Emergency Medicine

## 2016-04-23 ENCOUNTER — Emergency Department
Admission: EM | Admit: 2016-04-23 | Discharge: 2016-04-23 | Disposition: A | Discharge status: Home / Self Care | Payer: Medicaid Other | Attending: Student in an Organized Health Care Education/Training Program | Admitting: Student in an Organized Health Care Education/Training Program

## 2016-04-23 DIAGNOSIS — Y990 Civilian activity done for income or pay: Secondary | ICD-10-CM | POA: Insufficient documentation

## 2016-04-23 DIAGNOSIS — S40862A Insect bite (nonvenomous) of left upper arm, initial encounter: Secondary | ICD-10-CM | POA: Insufficient documentation

## 2016-04-23 DIAGNOSIS — W57XXXA Bitten or stung by nonvenomous insect and other nonvenomous arthropods, initial encounter: Secondary | ICD-10-CM | POA: Insufficient documentation

## 2016-04-23 DIAGNOSIS — J45909 Unspecified asthma, uncomplicated: Secondary | ICD-10-CM | POA: Insufficient documentation

## 2016-04-23 DIAGNOSIS — Y939 Activity, unspecified: Secondary | ICD-10-CM | POA: Insufficient documentation

## 2016-04-23 DIAGNOSIS — Y929 Unspecified place or not applicable: Secondary | ICD-10-CM | POA: Insufficient documentation

## 2016-04-23 MED ORDER — HYDROCORTISONE 2.5 % EX OINT: TOPICAL_OINTMENT | Freq: Two times a day (BID) | CUTANEOUS | 0 refills | Status: DC

## 2016-04-23 NOTE — ED Provider Notes (Signed)
[facility] Emergency Department Provider Note  ____________________________________________  Time seen: Approximately 5:15 PM  I have reviewed the triage vital signs and the nursing notes.   HISTORY  Chief Complaint Insect Bite   HPI Lindsey Cain is a 31 y.o. female who presents to the emergency department for evaluation of possible spider bite. She was moving boxes at work today and soon after noticed a red area on her left upper arm that is itchy and red. She also has an area on her abdomen that appears the same. She did not feel or see anything bite her, but is concerned that it may have been a spider. She has not used any over the counter medications or creams to treat the redness or itching.  Past Medical History:  Diagnosis Date  . Anemia   . Asthma   . Herpes genitalis     Patient Active Problem List   Diagnosis Date Noted  . Endomyometritis 10/22/2015  . Postpartum care following vaginal delivery 10/08/2015  . Back pain affecting pregnancy in second trimester 10/08/2015  . Iron deficiency anemia 08/28/2015    Past Surgical History:  Procedure Laterality Date  . DILATION AND CURETTAGE OF UTERUS N/A 10/22/2015   Procedure: DILATATION AND CURETTAGE;  Surgeon: Gae Dry, MD;  Location: [facility] ORS;  Service: Gynecology;  Laterality: N/A;    Prior to Admission medications   Medication Sig Start Date End Date Taking? Authorizing Provider  ferrous sulfate 325 (65 FE) MG tablet Take 325 mg by mouth 3 (three) times daily with meals.    Historical Provider, MD  HYDROcodone-acetaminophen (NORCO) 5-325 MG tablet Take 1 tablet by mouth every 6 (six) hours as needed for moderate pain. 10/24/15   Will Bonnet, MD  ibuprofen (ADVIL,MOTRIN) 600 MG tablet Take 1 tablet (600 mg total) by mouth every 6 (six) hours. 10/09/15   Burlene Arnt, CNM  medroxyPROGESTERone (DEPO-PROVERA) 150 MG/ML injection Inject 1 mL (150 mg total) into the muscle once.  10/09/15 10/09/15  Burlene Arnt, CNM  Prenatal Multivit-Min-Fe-FA (PRENATAL VITAMINS) 0.8 MG tablet Take 1 tablet by mouth daily. 05/27/15   Carrie Mew, MD    Allergies Patient has no known allergies.  Family History  Problem Relation Age of Onset  . Diabetes Neg Hx     Social History Social History  Substance Use Topics  . Smoking status: Never Smoker  . Smokeless tobacco: Never Used  . Alcohol use No    Review of Systems  Constitutional: Negative for fever/chills Respiratory: Negative for shortness of breath. Musculoskeletal: Negative for pain. Skin: Positive for pruritic lesion on left upper arm and abdomen. Neurological: Negative for headaches, focal weakness or numbness. ____________________________________________   PHYSICAL EXAM:  VITAL SIGNS: ED Triage Vitals  Enc Vitals Group     BP 04/23/16 1709 107/68     Pulse Rate 04/23/16 1709 85     Resp 04/23/16 1709 18     Temp 04/23/16 1709 98.9 F (37.2 C)     Temp Source 04/23/16 1709 Oral     SpO2 04/23/16 1709 100 %     Weight 04/23/16 1710 125 lb (56.7 kg)     Height 04/23/16 1710 5\' 7"  (1.702 m)     Head Circumference --      Peak Flow --      Pain Score 04/23/16 1710 0     Pain Loc --      Pain Edu? --      Excl. in [place]? --  Constitutional: Alert and oriented. Well appearing and in no acute distress. Eyes: Conjunctivae are normal. EOMI. Nose: No congestion/rhinnorhea. Mouth/Throat: Mucous membranes are moist. Airway patent without evidence of edema.  Neck: No stridor. Respiratory: Normal respiratory effort.  No retractions. Lungs clear to auscultation. Musculoskeletal: FROM throughout. Neurologic:  Normal speech and language. No gross focal neurologic deficits are appreciated. Skin:  Erythematous, maculopapular lesion on the left tricep.without fluctuance and appears as an insect bite.  ____________________________________________   LABS (all labs ordered are listed, but only abnormal  results are displayed)  Labs Reviewed - No data to display ____________________________________________  EKG   ____________________________________________  RADIOLOGY   ____________________________________________   PROCEDURES  Procedure(s) performed: None ____________________________________________   INITIAL IMPRESSION / ASSESSMENT AND PLAN / ED COURSE  31 year old female presenting to the ER for evaluation after noticing a pruritic erythematous lesion on her left tricep and abdomen earlier today. She was treated with hydrocortisone cream and advised to take Benadryl every 6 hours as needed. She was instructed to follow-up with primary care provider for choice for symptoms that are not improving over the next few days. She was instructed to return to the emergency department for symptoms that change or worsen if she is unable schedule an appointment.  Pertinent labs & imaging results that were available during my care of the patient were reviewed by me and considered in my medical decision making (see chart for details).  ____________________________________________   FINAL CLINICAL IMPRESSION(S) / ED DIAGNOSES  Final diagnoses:  None    New Prescriptions   No medications on file    Note:  This document was prepared using Dragon voice recognition software and may include unintentional dictation errors.    Victorino Dike, FNP 04/23/16 2134    Merlyn Lot, MD 04/23/16 [phone]

## 2016-04-23 NOTE — Discharge Instructions (Signed)
Take Benadryl every 6 hours if needed for itching. Follow up with your primary care provider for symptoms that are not improving over the next few days. Return to the ER for symptoms that change or worsen if unable to schedule an appointment.

## 2016-04-23 NOTE — ED Triage Notes (Signed)
Patient states, "I felt something biting me at work.  I just wanted to make sure it wasn't a spider."  Patient is in no obvious distress at this time.

## 2017-05-11 LAB — HM PAP SMEAR: HM Pap smear: NEGATIVE

## 2018-09-10 DIAGNOSIS — R1310 Dysphagia, unspecified: Secondary | ICD-10-CM

## 2018-09-10 DIAGNOSIS — Z8742 Personal history of other diseases of the female genital tract: Secondary | ICD-10-CM

## 2018-09-11 ENCOUNTER — Ambulatory Visit: Payer: Self-pay

## 2018-09-27 ENCOUNTER — Other Ambulatory Visit: Payer: Self-pay

## 2018-09-27 ENCOUNTER — Ambulatory Visit (LOCAL_COMMUNITY_HEALTH_CENTER): Payer: Self-pay

## 2018-09-27 VITALS — BP 109/69 | Ht 66.5 in | Wt 129.8 lb

## 2018-09-27 DIAGNOSIS — Z30013 Encounter for initial prescription of injectable contraceptive: Secondary | ICD-10-CM

## 2018-09-27 DIAGNOSIS — Z3042 Encounter for surveillance of injectable contraceptive: Secondary | ICD-10-CM

## 2018-09-27 DIAGNOSIS — Z3009 Encounter for other general counseling and advice on contraception: Secondary | ICD-10-CM

## 2018-09-27 MED ORDER — MEDROXYPROGESTERONE ACETATE 150 MG/ML IM SUSP
150.0000 mg | Freq: Once | INTRAMUSCULAR | Status: AC
  Administered 2018-09-27: 150 mg via INTRAMUSCULAR

## 2018-09-27 NOTE — Progress Notes (Signed)
Patient here for Depo restart. Depo weeks 24. Patient due for PE (last PE 05/11/17).Jenetta Downer, RN

## 2018-09-27 NOTE — Progress Notes (Signed)
Depo consent signed. Depo given today.Jenetta Downer, RN

## 2018-09-27 NOTE — Progress Notes (Signed)
   [place] problem visit  [place] Department  Subjective:  Lindsey Cain is a 33 y.o. being seen today for   Chief Complaint  Patient presents with  . Contraception    Depo restart    Client states that last Depo was 04/10/2018.  Last sex late March/early April  She has been spotting x 3 days.  Client states that she doesn't have menses when on Depo.    Does the patient have a current or past history of drug use? No   No components found for: HCV]   Health Maintenance Due  Topic Date Due  . TETANUS/TDAP  05/05/2005    ROS  The following portions of the patient's history were reviewed and updated as appropriate: allergies, current medications, past family history, past medical history, past social history, past surgical history and problem list. Problem list updated.   See flowsheet for other program required questions.  Objective:   Vitals:   09/27/18 1448  BP: 109/69  Weight: 129 lb 12.8 oz (58.9 kg)  Height: 5' 6.5" (1.689 m)    Physical Exam not indicated    Assessment and Plan:  [name] is a 33 y.o. female presenting to the [facility] Department for a Women's Health problem visit  There are no diagnoses linked to this encounter.  Contraception May have Depo Provera 150 mg Im x2.  Co. For client to call for Depo appt and possible complete RP. (last pap-NIL  and PE 05/11/2017)  No follow-ups on file.  No future appointments.  Hassell Done, FNP

## 2018-12-17 ENCOUNTER — Other Ambulatory Visit: Payer: Self-pay

## 2018-12-17 ENCOUNTER — Encounter: Payer: Self-pay | Admitting: Intensive Care

## 2018-12-17 ENCOUNTER — Emergency Department: Payer: Self-pay

## 2018-12-17 ENCOUNTER — Emergency Department
Admission: EM | Admit: 2018-12-17 | Discharge: 2018-12-17 | Disposition: A | Discharge status: Home / Self Care | Payer: Self-pay | Attending: Emergency Medicine | Admitting: Emergency Medicine

## 2018-12-17 DIAGNOSIS — J45909 Unspecified asthma, uncomplicated: Secondary | ICD-10-CM | POA: Insufficient documentation

## 2018-12-17 DIAGNOSIS — R9389 Abnormal findings on diagnostic imaging of other specified body structures: Secondary | ICD-10-CM | POA: Insufficient documentation

## 2018-12-17 DIAGNOSIS — R1030 Lower abdominal pain, unspecified: Secondary | ICD-10-CM | POA: Insufficient documentation

## 2018-12-17 DIAGNOSIS — N840 Polyp of corpus uteri: Secondary | ICD-10-CM | POA: Insufficient documentation

## 2018-12-17 DIAGNOSIS — Z79899 Other long term (current) drug therapy: Secondary | ICD-10-CM | POA: Insufficient documentation

## 2018-12-17 LAB — URINALYSIS, COMPLETE (UACMP) WITH MICROSCOPIC
Bacteria, UA: NONE SEEN
Bilirubin Urine: NEGATIVE
Glucose, UA: NEGATIVE mg/dL
Ketones, ur: NEGATIVE mg/dL
Leukocytes,Ua: NEGATIVE
Nitrite: NEGATIVE
Protein, ur: 30 mg/dL — AB
Specific Gravity, Urine: 1.026 (ref 1.005–1.030)
pH: 5 (ref 5.0–8.0)

## 2018-12-17 LAB — CBC
HCT: 39.5 % (ref 36.0–46.0)
Hemoglobin: 12.8 g/dL (ref 12.0–15.0)
MCH: 28.3 pg (ref 26.0–34.0)
MCHC: 32.4 g/dL (ref 30.0–36.0)
MCV: 87.4 fL (ref 80.0–100.0)
Platelets: 194 10*3/uL (ref 150–400)
RBC: 4.52 MIL/uL (ref 3.87–5.11)
RDW: 12.7 % (ref 11.5–15.5)
WBC: 6 10*3/uL (ref 4.0–10.5)
nRBC: 0 % (ref 0.0–0.2)

## 2018-12-17 LAB — COMPREHENSIVE METABOLIC PANEL
ALT: 11 U/L (ref 0–44)
AST: 19 U/L (ref 15–41)
Albumin: 3.8 g/dL (ref 3.5–5.0)
Alkaline Phosphatase: 84 U/L (ref 38–126)
Anion gap: 9 (ref 5–15)
BUN: 9 mg/dL (ref 6–20)
CO2: 25 mmol/L (ref 22–32)
Calcium: 9 mg/dL (ref 8.9–10.3)
Chloride: 102 mmol/L (ref 98–111)
Creatinine, Ser: 0.73 mg/dL (ref 0.44–1.00)
GFR calc Af Amer: >60 mL/min (ref >60)
GFR calc non Af Amer: >60 mL/min (ref >60)
Glucose, Bld: 94 mg/dL (ref 70–99)
Potassium: 3.5 mmol/L (ref 3.5–5.1)
Sodium: 136 mmol/L (ref 135–145)
Total Bilirubin: 1.2 mg/dL (ref 0.3–1.2)
Total Protein: 7.7 g/dL (ref 6.5–8.1)

## 2018-12-17 LAB — LIPASE, BLOOD: Lipase: 26 U/L (ref 11–51)

## 2018-12-17 LAB — POCT PREGNANCY, URINE: Preg Test, Ur: NEGATIVE

## 2018-12-17 MED ORDER — NAPROXEN 500 MG PO TABS
500.0000 mg | ORAL_TABLET | Freq: Once | ORAL | Status: AC
  Administered 2018-12-17: 500 mg via ORAL
  Filled 2018-12-17: qty 1

## 2018-12-17 MED ORDER — FENTANYL CITRATE (PF) 100 MCG/2ML IJ SOLN
100.0000 ug | Freq: Once | INTRAMUSCULAR | Status: AC
  Administered 2018-12-17: 100 ug via INTRAVENOUS
  Filled 2018-12-17: qty 2

## 2018-12-17 MED ORDER — NAPROXEN 500 MG PO TABS: 500.0000 mg | ORAL_TABLET | Freq: Two times a day (BID) | ORAL | 0 refills | Status: DC

## 2018-12-17 MED ORDER — IOHEXOL 300 MG/ML  SOLN
75.0000 mL | Freq: Once | INTRAMUSCULAR | Status: AC | PRN
  Administered 2018-12-17: 75 mL via INTRAVENOUS

## 2018-12-17 MED ORDER — ONDANSETRON HCL 4 MG/2ML IJ SOLN
4.0000 mg | Freq: Once | INTRAMUSCULAR | Status: AC
  Administered 2018-12-17: 4 mg via INTRAVENOUS
  Filled 2018-12-17: qty 2

## 2018-12-17 NOTE — ED Triage Notes (Signed)
Pt presents with abd pain since last night. Denies N/V/D

## 2018-12-17 NOTE — ED Provider Notes (Signed)
[facility] Emergency Department Provider Note ____________________________________________   None    (approximate)  I have reviewed the triage vital signs and the nursing notes.   HISTORY  Chief Complaint Abdominal Pain  HPI Lindsey Cain is a 33 y.o. female who presents to the emergency department for treatment and evaluation of abdominal pain.  Patient states that she has periumbilical abdominal pain that radiates into the lower abdomen.  She denies nausea, vomiting, diarrhea, or fever.  Appetite has been somewhat decreased but she has still been able to eat.  She denies dysuria or back pain.  She denies vaginal discharge or bleeding.  She states that onset of symptoms was 3 days ago, then resolved and returned again yesterday.         Past Medical History:  Diagnosis Date  . Anemia   . Asthma   . Herpes genitalis     Patient Active Problem List   Diagnosis Date Noted  . Endomyometritis 10/22/2015  . Postpartum care following vaginal delivery 10/08/2015  . Back pain affecting pregnancy in second trimester 10/08/2015  . Iron deficiency anemia 08/28/2015  . Difficulty in swallowing 05/22/2015  . History of abnormal cervical Pap smear 04/24/2015    Past Surgical History:  Procedure Laterality Date  . DILATION AND CURETTAGE OF UTERUS N/A 10/22/2015   Procedure: DILATATION AND CURETTAGE;  Surgeon: Gae Dry, MD;  Location: [facility] ORS;  Service: Gynecology;  Laterality: N/A;    Prior to Admission medications   Medication Sig Start Date End Date Taking? Authorizing Provider  ferrous sulfate 325 (65 FE) MG tablet Take 325 mg by mouth 3 (three) times daily with meals.    [provider]  medroxyPROGESTERone (DEPO-PROVERA) 150 MG/ML injection Inject 1 mL (150 mg total) into the muscle once. 10/09/15 10/09/15  Burlene Arnt, CNM  naproxen (NAPROSYN) 500 MG tablet Take 1 tablet (500 mg total) by mouth 2 (two) times daily with a meal.  12/17/18   Chyan Carnero B, FNP    Allergies Penicillins  Family History  Problem Relation Age of Onset  . Lupus Father   . Diabetes Paternal Grandfather   . Hypertension Paternal Grandfather     Social History Social History   Tobacco Use  . Smoking status: Never Smoker  . Smokeless tobacco: Never Used  Substance Use Topics  . Alcohol use: No  . Drug use: No    Review of Systems  Constitutional: Subjective fever. Eyes: No visual changes. ENT: No sore throat. Cardiovascular: Denies chest pain. Respiratory: Denies shortness of breath. Gastrointestinal: Positive for abdominal pain.  No nausea, no vomiting.  No diarrhea.  No constipation. Genitourinary: Negative for dysuria. Musculoskeletal: Negative for back pain. Skin: Negative for rash. Neurological: Negative for headaches, focal weakness or numbness. ____________________________________________   PHYSICAL EXAM:  VITAL SIGNS: ED Triage Vitals [12/17/18 1413]  Enc Vitals Group     BP 117/63     Pulse Rate 99     Resp 16     Temp 100.3 F (37.9 C)     Temp Source Oral     SpO2 99 %     Weight 130 lb (59 kg)     Height 5' 6.5" (1.689 m)     Head Circumference      Peak Flow      Pain Score 10     Pain Loc      Pain Edu?      Excl. in [place]?  Constitutional: Alert and oriented. Well appearing and in no acute distress. Eyes: Conjunctivae are normal. PERRL. EOMI. Head: Atraumatic. Nose: No congestion/rhinnorhea. Mouth/Throat: Mucous membranes are moist.  Oropharynx non-erythematous. Neck: No stridor.   Hematological/Lymphatic/Immunilogical: No cervical lymphadenopathy. Cardiovascular: Normal rate, regular rhythm. Grossly normal heart sounds.  Good peripheral circulation. Respiratory: Normal respiratory effort.  No retractions. Lungs CTAB. Gastrointestinal: Soft and periumbilical tenderness and transverse lower abdominal tenderness on palpation. No distention. No abdominal bruits. No CVA tenderness.  Genitourinary:  Musculoskeletal: No lower extremity tenderness nor edema.  No joint effusions. Neurologic:  Normal speech and language. No gross focal neurologic deficits are appreciated. No gait instability. Skin:  Skin is warm, dry and intact. No rash noted. Psychiatric: Mood and affect are normal. Speech and behavior are normal.  ____________________________________________   LABS (all labs ordered are listed, but only abnormal results are displayed)  Labs Reviewed  URINALYSIS, COMPLETE (UACMP) WITH MICROSCOPIC - Abnormal; Notable for the following components:      Result Value   Color, Urine YELLOW (*)    APPearance HAZY (*)    Hgb urine dipstick SMALL (*)    Protein, ur 30 (*)    All other components within normal limits  LIPASE, BLOOD  COMPREHENSIVE METABOLIC PANEL  CBC  POC URINE PREG, ED  POCT PREGNANCY, URINE   ____________________________________________  EKG  Not indicated. ____________________________________________  RADIOLOGY  ED MD interpretation:    CT of the abdomen pelvis shows a cystic structure within the endometrial canal that extends through the myometrium on the fundus on the right.  Ultrasound of the pelvis shows avascular central uterine mass at the fundus that could possibly represent an endometrial polyp or fibroid.  Official radiology report(s): Ct Abdomen Pelvis W Contrast  Result Date: 12/17/2018 CLINICAL DATA:  Onset lower abdominal pain last night. EXAM: CT ABDOMEN AND PELVIS WITH CONTRAST TECHNIQUE: Multidetector CT imaging of the abdomen and pelvis was performed using the standard protocol following bolus administration of intravenous contrast. CONTRAST:  75 mL OMNIPAQUE IOHEXOL 300 MG/ML  SOLN COMPARISON:  None. FINDINGS: Lower chest: Lung bases clear. No pleural or pericardial effusion. Heart size normal. Hepatobiliary: No focal liver abnormality is seen. No gallstones, gallbladder wall thickening, or biliary dilatation. Pancreas:  Unremarkable. No pancreatic ductal dilatation or surrounding inflammatory changes. Spleen: Normal in size without focal abnormality. Adrenals/Urinary Tract: Adrenal glands are unremarkable. Kidneys are normal, without renal calculi, focal lesion, or hydronephrosis. Bladder is unremarkable. Stomach/Bowel: Stomach is within normal limits. Appendix appears normal. No evidence of bowel wall thickening, distention, or inflammatory changes. Vascular/Lymphatic: No significant vascular findings are present. No enlarged abdominal or pelvic lymph nodes. Reproductive: A cystic structure in the superior aspect of the endometrial canal extends into the myometrium and exits the right side of the fundus. The lesion measures approximately 3.5 cm craniocaudal by 1.6 cm transverse by 0.7 cm AP. Adnexa appear normal. Small focal defect in the fundus of the uterus may be postoperative. Other: None. Musculoskeletal: No acute or focal abnormality. Transitional lumbosacral anatomy noted. IMPRESSION: Cystic structure within the endometrial canal extends through the myometrium of the fundus on the right. Etiology of this finding is unclear. Pelvic ultrasound is recommended for further evaluation. Electronically Signed   By: Inge Rise M.D.   On: 12/17/2018 17:24   US Pelvic Complete With Transvaginal  Result Date: 12/17/2018 CLINICAL DATA:  Lower abdominal pain last night. Abnormal endometrium on CT. EXAM: TRANSABDOMINAL AND TRANSVAGINAL ULTRASOUND OF PELVIS TECHNIQUE: Both transabdominal and transvaginal ultrasound examinations of  the pelvis were performed. Transabdominal technique was performed for global imaging of the pelvis including uterus, ovaries, adnexal regions, and pelvic cul-de-sac. It was necessary to proceed with endovaginal exam following the transabdominal exam to visualize the uterus, ovaries, and adnexa. COMPARISON:  CT of earlier today. FINDINGS: Uterus Measurements: 7.4 x 5.8 x 6.7 cm = volume: 151 mL. No  myometrial lesion. Endometrium Within the central uterine fundus, a vascular lesion measures on the order of 3.3 x 2.6 x 3.2 cm. Example including on image 71 and 66. Right ovary Measurements: 4.1 x 2.8 x 2.0 cm = volume: 12 mL. Normal appearance/no adnexal mass. Left ovary Measurements: 2.2 x 1.2 x 1.8 cm = volume: 2.4 mL. Normal appearance/no adnexal mass. Other findings Trace free pelvic fluid is likely physiologic. IMPRESSION: Vascular central uterine mass at the fundus could represent an endometrial polyp or a submucosal fibroid. Potential clinical strategies include sonohysterogram or nonemergent outpatient pelvic MRI. Electronically Signed   By: Abigail Miyamoto M.D.   On: 12/17/2018 18:59    ____________________________________________   PROCEDURES  Procedure(s) performed (including Critical Care):  Procedures  ____________________________________________   INITIAL IMPRESSION / ASSESSMENT AND PLAN     33 year old female presenting to the emergency department for treatment and evaluation of abdominal pain.  See HPI for further details.  Exam does reveal some periumbilical tenderness and bilateral lower abdomen tenderness on palpation.  She has a temperature of 100.3 with a pulse rate of 99.  She denies any vomiting or diarrhea.  Plan will be to get some lab studies and a CT of the abdomen and pelvis  DIFFERENTIAL DIAGNOSIS  Appendicitis, diverticulitis, diverticulosis, abdominal abscess, menstrual cramps.  ED COURSE  Abnormal finding on the CT prompted pelvic ultrasound.  Ultrasound confirms what appears to be a polyp or fibroid.  Patient's pain improved somewhat after administration of fentanyl and Zofran, however the patient did not like the way that the fentanyl made her feel and requested that she not receive any additional doses.  She will be given Naprosyn tonight and a prescription for the same.  Dr. Kenton Kingfisher was, consulted regarding ultrasound findings and advises that she follow-up  in the office next week.  Results and plan was discussed with the patient who agrees.  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  For increasing pain or other symptoms of concern, the patient is to return to the emergency department if she is unable to see her primary care provider or the gynecologist. ____________________________________________   FINAL CLINICAL IMPRESSION(S) / ED DIAGNOSES  Final diagnoses:  Abnormal finding on imaging  Uterine polyp     ED Discharge Orders         Ordered    naproxen (NAPROSYN) 500 MG tablet  2 times daily with meals     12/17/18 1940           Note:  This document was prepared using Dragon voice recognition software and may include unintentional dictation errors.   Victorino Dike, FNP 12/17/18 1948    Blake Divine, MD 12/18/18 0020

## 2018-12-17 NOTE — ED Notes (Signed)
Patient transported to Ultrasound 

## 2018-12-17 NOTE — Discharge Instructions (Signed)
Please call tomorrow to schedule an appointment to see Dr. Kenton Kingfisher.  If symptoms change or worsen and you are unable to see your primary care provider or gynecology, please return to the emergency department.

## 2018-12-17 NOTE — ED Notes (Signed)
Patient transported to CT 

## 2018-12-18 ENCOUNTER — Telehealth: Payer: Self-pay | Admitting: Obstetrics & Gynecology

## 2018-12-18 NOTE — Telephone Encounter (Signed)
Patient is schedule for 12/21/18 with SDJ

## 2018-12-18 NOTE — Telephone Encounter (Signed)
Attempt to reach patient voicemail not set up unable to leave message

## 2018-12-18 NOTE — Telephone Encounter (Signed)
-----   Message from Gae Dry, MD sent at 12/17/2018  7:28 PM EDT ----- Regarding: Appt- ER follow up, any MD this week

## 2018-12-21 ENCOUNTER — Encounter: Payer: Self-pay | Admitting: Obstetrics and Gynecology

## 2018-12-21 ENCOUNTER — Ambulatory Visit (INDEPENDENT_AMBULATORY_CARE_PROVIDER_SITE_OTHER): Payer: Self-pay | Admitting: Obstetrics and Gynecology

## 2018-12-21 ENCOUNTER — Other Ambulatory Visit: Payer: Self-pay

## 2018-12-21 VITALS — BP 100/75 | Ht 67.0 in | Wt 138.0 lb

## 2018-12-21 DIAGNOSIS — R1084 Generalized abdominal pain: Secondary | ICD-10-CM

## 2018-12-21 DIAGNOSIS — N858 Other specified noninflammatory disorders of uterus: Secondary | ICD-10-CM

## 2018-12-21 NOTE — Progress Notes (Signed)
Obstetrics & Gynecology Office Visit    Chief Complaint  Patient presents with  . Follow-up    ER, pt feels fine now    History of Present Illness: 33 y.o. [id number] female who presents in follow up from an ER visit on 12/21/2018 for abdominal pain.  The pain started about a week prior she had some abdominal pain that she thought was perhaps early menstrual cramping.  A couple of days later the pain was so bad she had to leave work.  Over the next few days the pain was OK.  Four days ago she had a temperature of 100.16F.  On workup a CT noted a cystic structure in her uterus. Normal appendix. No other abnormal findings.   A follow up ultrasound showed a vascular lesion that measured 3.3 x 2.6 x 3.2 cm.  She takes Depo Provera for contraception.  Her last injection was around 09/27/2018.  She was not having periods.  She is considering stopping the medication to take a break from the medication. She had never had any prior episodes.  She has not checked her temperature at home. She had a normal temperature check at work this morning.  Her pain is not present right now.  She denies nausea, vomiting, diarrhea, and constipation. She states her last pap smear was about 1 year ago and was normal.      Past Medical History:  Diagnosis Date  . Anemia   . Asthma   . Herpes genitalis     Past Surgical History:  Procedure Laterality Date  . DILATION AND CURETTAGE OF UTERUS N/A 10/22/2015   Procedure: DILATATION AND CURETTAGE;  Surgeon: Gae Dry, MD;  Location: [facility] ORS;  Service: Gynecology;  Laterality: N/A;    Gynecologic History: No LMP recorded. Patient has had an injection.  Obstetric [id number], s/p SVD x 4  Family History  Problem Relation Age of Onset  . Lupus Father   . Diabetes Paternal Grandfather   . Hypertension Paternal Grandfather     Social History   Socioeconomic History  . Marital status: Single    Spouse name: Not on file  . Number of children: Not on  file  . Years of education: Not on file  . Highest education level: Not on file  Occupational History  . Not on file  Social Needs  . Financial resource strain: Not on file  . Food insecurity    Worry: Not on file    Inability: Not on file  . Transportation needs    Medical: Not on file    Non-medical: Not on file  Tobacco Use  . Smoking status: Never Smoker  . Smokeless tobacco: Never Used  Substance and Sexual Activity  . Alcohol use: No  . Drug use: No  . Sexual activity: Not Currently    Birth control/protection: Injection  Lifestyle  . Physical activity    Days per week: Not on file    Minutes per session: Not on file  . Stress: Not on file  Relationships  . Social Herbalist on phone: Not on file    Gets together: Not on file    Attends religious service: Not on file    Active member of club or organization: Not on file    Attends meetings of clubs or organizations: Not on file    Relationship status: Not on file  . Intimate partner violence    Fear of current or ex partner: Not on file  Emotionally abused: Not on file    Physically abused: Not on file    Forced sexual activity: Not on file  Other Topics Concern  . Not on file  Social History Narrative  . Not on file    Allergies  Allergen Reactions  . Penicillins     Prior to Admission medications   Medication Sig Start Date End Date Taking? Authorizing Provider  medroxyPROGESTERone (DEPO-PROVERA) 150 MG/ML injection Inject 1 mL (150 mg total) into the muscle once. 10/09/15 10/09/15  Burlene Arnt, CNM    Review of Systems  Constitutional: Negative.   HENT: Negative.   Eyes: Negative.   Respiratory: Negative.   Cardiovascular: Negative.   Gastrointestinal: Negative.   Genitourinary: Negative.   Musculoskeletal: Negative.   Skin: Negative.   Neurological: Negative.   Psychiatric/Behavioral: Negative.      Physical Exam BP 100/75   Ht 5\' 7"  (1.702 m)   Wt 138 lb (62.6 kg)   BMI  21.61 kg/m  No LMP recorded. Patient has had an injection. Physical Exam Constitutional:      General: She is not in acute distress.    Appearance: Normal appearance. She is well-developed.  HENT:     Head: Normocephalic and atraumatic.  Eyes:     General: No scleral icterus.    Conjunctiva/sclera: Conjunctivae normal.  Neck:     Musculoskeletal: Normal range of motion and neck supple.  Cardiovascular:     Rate and Rhythm: Normal rate and regular rhythm.     Heart sounds: No murmur. No friction rub. No gallop.   Pulmonary:     Effort: Pulmonary effort is normal. No respiratory distress.     Breath sounds: Normal breath sounds. No wheezing or rales.  Abdominal:     General: Bowel sounds are normal. There is no distension.     Palpations: Abdomen is soft. There is no mass.     Tenderness: There is no abdominal tenderness. There is no guarding or rebound.  Musculoskeletal: Normal range of motion.  Neurological:     General: No focal deficit present.     Mental Status: She is alert and oriented to person, place, and time.     Cranial Nerves: No cranial nerve deficit.  Skin:    General: Skin is warm and dry.     Findings: No erythema.  Psychiatric:        Mood and Affect: Mood normal.        Behavior: Behavior normal.        Judgment: Judgment normal.     Assessment: 33 y.o. [id number] female here for  1. Uterine mass   2. Generalized abdominal pain      Plan: Problem List Items Addressed This Visit    None    Visit Diagnoses    Uterine mass    -  Primary   Generalized abdominal pain         Discussed findings with patient from CT scan and pelvic ultrasound.  Recommend removal of mass or further evaluation.  Discussed that she could watch and wait and we could repeat ultrasound in 2-3 months. We also discussed further characterization with sonohysterogram.  Lastly, we discussed surgery to remove the mass via hysteroscopy. She states that she would like to have the mass  removed.  So, will schedule a hysteroscopy, dilation and curettage, and removal of uterine mass.  Will shoot for 10/29, as she would like the surgery on a Thursday or Friday, if possible.   25  minutes spent in face to face discussion with > 50% spent in counseling,management, and coordination of care of her uterine mass and general abdominal pain.   Prentice Docker, MD 12/21/2018 2:04 PM   a

## 2018-12-21 NOTE — H&P (View-Only) (Signed)
Obstetrics & Gynecology Office Visit    Chief Complaint  Patient presents with  . Follow-up    ER, pt feels fine now    History of Present Illness: 33 y.o. [id number] female who presents in follow up from an ER visit on 12/21/2018 for abdominal pain.  The pain started about a week prior she had some abdominal pain that she thought was perhaps early menstrual cramping.  A couple of days later the pain was so bad she had to leave work.  Over the next few days the pain was OK.  Four days ago she had a temperature of 100.13F.  On workup a CT noted a cystic structure in her uterus. Normal appendix. No other abnormal findings.   A follow up ultrasound showed a vascular lesion that measured 3.3 x 2.6 x 3.2 cm.  She takes Depo Provera for contraception.  Her last injection was around 09/27/2018.  She was not having periods.  She is considering stopping the medication to take a break from the medication. She had never had any prior episodes.  She has not checked her temperature at home. She had a normal temperature check at work this morning.  Her pain is not present right now.  She denies nausea, vomiting, diarrhea, and constipation. She states her last pap smear was about 1 year ago and was normal.      Past Medical History:  Diagnosis Date  . Anemia   . Asthma   . Herpes genitalis     Past Surgical History:  Procedure Laterality Date  . DILATION AND CURETTAGE OF UTERUS N/A 10/22/2015   Procedure: DILATATION AND CURETTAGE;  Surgeon: Gae Dry, MD;  Location: [facility] ORS;  Service: Gynecology;  Laterality: N/A;    Gynecologic History: No LMP recorded. Patient has had an injection.  Obstetric [id number], s/p SVD x 4  Family History  Problem Relation Age of Onset  . Lupus Father   . Diabetes Paternal Grandfather   . Hypertension Paternal Grandfather     Social History   Socioeconomic History  . Marital status: Single    Spouse name: Not on file  . Number of children: Not on  file  . Years of education: Not on file  . Highest education level: Not on file  Occupational History  . Not on file  Social Needs  . Financial resource strain: Not on file  . Food insecurity    Worry: Not on file    Inability: Not on file  . Transportation needs    Medical: Not on file    Non-medical: Not on file  Tobacco Use  . Smoking status: Never Smoker  . Smokeless tobacco: Never Used  Substance and Sexual Activity  . Alcohol use: No  . Drug use: No  . Sexual activity: Not Currently    Birth control/protection: Injection  Lifestyle  . Physical activity    Days per week: Not on file    Minutes per session: Not on file  . Stress: Not on file  Relationships  . Social Herbalist on phone: Not on file    Gets together: Not on file    Attends religious service: Not on file    Active member of club or organization: Not on file    Attends meetings of clubs or organizations: Not on file    Relationship status: Not on file  . Intimate partner violence    Fear of current or ex partner: Not on file  Emotionally abused: Not on file    Physically abused: Not on file    Forced sexual activity: Not on file  Other Topics Concern  . Not on file  Social History Narrative  . Not on file    Allergies  Allergen Reactions  . Penicillins     Prior to Admission medications   Medication Sig Start Date End Date Taking? Authorizing Provider  medroxyPROGESTERone (DEPO-PROVERA) 150 MG/ML injection Inject 1 mL (150 mg total) into the muscle once. 10/09/15 10/09/15  Burlene Arnt, CNM    Review of Systems  Constitutional: Negative.   HENT: Negative.   Eyes: Negative.   Respiratory: Negative.   Cardiovascular: Negative.   Gastrointestinal: Negative.   Genitourinary: Negative.   Musculoskeletal: Negative.   Skin: Negative.   Neurological: Negative.   Psychiatric/Behavioral: Negative.      Physical Exam BP 100/75   Ht 5\' 7"  (1.702 m)   Wt 138 lb (62.6 kg)   BMI  21.61 kg/m  No LMP recorded. Patient has had an injection. Physical Exam Constitutional:      General: She is not in acute distress.    Appearance: Normal appearance. She is well-developed.  HENT:     Head: Normocephalic and atraumatic.  Eyes:     General: No scleral icterus.    Conjunctiva/sclera: Conjunctivae normal.  Neck:     Musculoskeletal: Normal range of motion and neck supple.  Cardiovascular:     Rate and Rhythm: Normal rate and regular rhythm.     Heart sounds: No murmur. No friction rub. No gallop.   Pulmonary:     Effort: Pulmonary effort is normal. No respiratory distress.     Breath sounds: Normal breath sounds. No wheezing or rales.  Abdominal:     General: Bowel sounds are normal. There is no distension.     Palpations: Abdomen is soft. There is no mass.     Tenderness: There is no abdominal tenderness. There is no guarding or rebound.  Musculoskeletal: Normal range of motion.  Neurological:     General: No focal deficit present.     Mental Status: She is alert and oriented to person, place, and time.     Cranial Nerves: No cranial nerve deficit.  Skin:    General: Skin is warm and dry.     Findings: No erythema.  Psychiatric:        Mood and Affect: Mood normal.        Behavior: Behavior normal.        Judgment: Judgment normal.     Assessment: 33 y.o. [id number] female here for  1. Uterine mass   2. Generalized abdominal pain      Plan: Problem List Items Addressed This Visit    None    Visit Diagnoses    Uterine mass    -  Primary   Generalized abdominal pain         Discussed findings with patient from CT scan and pelvic ultrasound.  Recommend removal of mass or further evaluation.  Discussed that she could watch and wait and we could repeat ultrasound in 2-3 months. We also discussed further characterization with sonohysterogram.  Lastly, we discussed surgery to remove the mass via hysteroscopy. She states that she would like to have the mass  removed.  So, will schedule a hysteroscopy, dilation and curettage, and removal of uterine mass.  Will shoot for 10/29, as she would like the surgery on a Thursday or Friday, if possible.   25  minutes spent in face to face discussion with > 50% spent in counseling,management, and coordination of care of her uterine mass and general abdominal pain.   Prentice Docker, MD 12/21/2018 2:04 PM   a

## 2018-12-22 ENCOUNTER — Telehealth: Payer: Self-pay | Admitting: Obstetrics and Gynecology

## 2018-12-22 NOTE — Telephone Encounter (Signed)
Patient is scheduled for H&P/consents day of surgery, Pre-admit testing phone interview on 12/28/18, COVID testing on 01/01/19, and OR on 01/04/19. No answer, v/m is full. No other phone#. No MyChart.

## 2018-12-22 NOTE — Telephone Encounter (Signed)
-----   Message from Will Bonnet, MD sent at 12/21/2018 11:10 AM EDT ----- Regarding: Schedule surgery Surgery Booking Request Patient Full Name:  Lindsey Cain  MRN: [id number]  DOB: 01-Feb-1986  Surgeon: Prentice Docker, MD  Requested Surgery Date and Time: 01/04/2019 Primary Diagnosis AND Code: Uterine mass (N85.8) Secondary Diagnosis and Code:  Surgical Procedure: Hysteroscopy D&C, removal of uterine mass L&D Notification: No Admission Status: same day surgery Length of Surgery: 60 min Special Case Needs: Yes, Myosure H&P: Yes Phone Interview???:  Yes Interpreter: No Language:  Medical Clearance:  No Special Scheduling Instructions: none Any known health/anesthesia issues, diabetes, sleep apnea, latex allergy, defibrillator/pacemaker?: No Acuity: P3   (P1 highest, P2 delay may cause harm, P3 low, elective gyn, P4 lowest)

## 2018-12-23 NOTE — Telephone Encounter (Signed)
Orders entered

## 2018-12-25 NOTE — Telephone Encounter (Signed)
Patient is aware of consents day of surgery (offered to bring patient in but she declined), Pre-admit testing phone interview on 12/28/18, COVID testing on 01/01/19 @ 8-10:30am, and OR on 01/04/19. Patient is aware she will be asked to quarantine after COVID testing, and will check with her employer and let me know if she needs to reschedule. Patient is aware she may receive calls from the [facility]. Patient confirmed she is Self Pay. Patient said she received a call from someone this morning and thinks it was the [employer]

## 2018-12-28 ENCOUNTER — Other Ambulatory Visit
Admission: RE | Admit: 2018-12-28 | Discharge: 2018-12-28 | Disposition: A | Discharge status: Home / Self Care | Payer: Self-pay | Source: Ambulatory Visit | Attending: Obstetrics and Gynecology | Admitting: Obstetrics and Gynecology

## 2019-01-01 ENCOUNTER — Telehealth: Payer: Self-pay | Admitting: Obstetrics and Gynecology

## 2019-01-01 ENCOUNTER — Other Ambulatory Visit
Admission: RE | Admit: 2019-01-01 | Discharge: 2019-01-01 | Disposition: A | Discharge status: Home / Self Care | Payer: Self-pay | Source: Ambulatory Visit | Attending: Obstetrics and Gynecology | Admitting: Obstetrics and Gynecology

## 2019-01-01 ENCOUNTER — Other Ambulatory Visit: Payer: Self-pay

## 2019-01-01 DIAGNOSIS — N858 Other specified noninflammatory disorders of uterus: Secondary | ICD-10-CM | POA: Insufficient documentation

## 2019-01-01 DIAGNOSIS — Z20828 Contact with and (suspected) exposure to other viral communicable diseases: Secondary | ICD-10-CM | POA: Insufficient documentation

## 2019-01-01 DIAGNOSIS — Z01812 Encounter for preprocedural laboratory examination: Secondary | ICD-10-CM | POA: Insufficient documentation

## 2019-01-01 LAB — SARS CORONAVIRUS 2 (TAT 6-24 HRS): SARS Coronavirus 2: NEGATIVE

## 2019-01-01 NOTE — Telephone Encounter (Signed)
Patient calling to find out what time she is supposed to be at OR on 10/29?

## 2019-01-02 ENCOUNTER — Other Ambulatory Visit: Payer: Self-pay

## 2019-01-02 ENCOUNTER — Encounter
Admission: RE | Admit: 2019-01-02 | Discharge: 2019-01-02 | Disposition: A | Discharge status: Home / Self Care | Payer: Self-pay | Source: Ambulatory Visit | Attending: Obstetrics and Gynecology | Admitting: Obstetrics and Gynecology

## 2019-01-02 NOTE — Telephone Encounter (Signed)
Pt aware.

## 2019-01-02 NOTE — Patient Instructions (Signed)
Your procedure is scheduled on: Thurs 10/29 Report to Day Surgery. To find out your arrival time please call [phone] between 1PM - 3PM on Wed. 10/28.  Remember: Instructions that are not followed completely may result in serious medical risk,  up to and including death, or upon the discretion of your surgeon and anesthesiologist your  surgery may need to be rescheduled.     _X__ 1. Do not eat food after midnight the night before your procedure.                 No gum chewing or hard candies. You may drink clear liquids up to 2 hours                 before you are scheduled to arrive for your surgery- DO not drink clear                 liquids within 2 hours of the start of your surgery.                 Clear Liquids include:  water, apple juice without pulp, clear carbohydrate                 drink such as Clearfast of Gatorade, Black Coffee or Tea (Do not add                 anything to coffee or tea).  __X__2.  On the morning of surgery brush your teeth with toothpaste and water, you                may rinse your mouth with mouthwash if you wish.  Do not swallow any toothpaste of mouthwash.     ___ 3.  No Alcohol for 24 hours before or after surgery.   ___ 4.  Do Not Smoke or use e-cigarettes For 24 Hours Prior to Your Surgery.                 Do not use any chewable tobacco products for at least 6 hours prior to                 surgery.  ____  5.  Bring all medications with you on the day of surgery if instructed.   __x__  6.  Notify your doctor if there is any change in your medical condition      (cold, fever, infections).     Do not wear jewelry, make-up, hairpins, clips or nail polish. Do not wear lotions, powders, or perfumes. You may wear deodorant. Do not shave 48 hours prior to surgery. Men may shave face and neck. Do not bring valuables to the hospital.    [facility] is not responsible for any belongings or valuables.  Contacts, dentures  or bridgework may not be worn into surgery. Leave your suitcase in the car. After surgery it may be brought to your room. For patients admitted to the hospital, discharge time is determined by your treatment team.   Patients discharged the day of surgery will not be allowed to drive home.   Please read over the following fact sheets that you were given:    ____ Take these medicines the morning of surgery with A SIP OF WATER:    1.none  2.   3.   4.  5.  6.  ____ Fleet Enema (as directed)   __x__ Use CHG Soap as directed  ____ Use inhalers on the day of surgery  ____ Stop metformin 2 days prior to surgery    ____ Take 1/2 of usual insulin dose the night before surgery. No insulin the morning          of surgery.   ____ Stop Coumadin/Plavix/aspirin on   __x__ Stop Anti-inflammatories naproxen (NAPROSYN) 500 MG tablet, ibuprofen or aspirin   ____ Stop supplements until after surgery.    ____ Bring C-Pap to the hospital.

## 2019-01-04 ENCOUNTER — Encounter
Admission: RE | Disposition: A | Discharge status: Home / Self Care | Payer: Self-pay | Source: Home / Self Care | Attending: Obstetrics and Gynecology

## 2019-01-04 ENCOUNTER — Ambulatory Visit: Payer: Self-pay | Admitting: Anesthesiology

## 2019-01-04 ENCOUNTER — Other Ambulatory Visit: Payer: Self-pay

## 2019-01-04 ENCOUNTER — Encounter: Payer: Self-pay | Admitting: *Deleted

## 2019-01-04 ENCOUNTER — Ambulatory Visit
Admission: RE | Admit: 2019-01-04 | Discharge: 2019-01-04 | Disposition: A | Discharge status: Home / Self Care | Payer: Self-pay | Attending: Obstetrics and Gynecology | Admitting: Obstetrics and Gynecology

## 2019-01-04 DIAGNOSIS — N858 Other specified noninflammatory disorders of uterus: Secondary | ICD-10-CM | POA: Diagnosis present

## 2019-01-04 DIAGNOSIS — Z880 Allergy status to penicillin: Secondary | ICD-10-CM | POA: Insufficient documentation

## 2019-01-04 DIAGNOSIS — D250 Submucous leiomyoma of uterus: Secondary | ICD-10-CM

## 2019-01-04 DIAGNOSIS — R9389 Abnormal findings on diagnostic imaging of other specified body structures: Secondary | ICD-10-CM | POA: Insufficient documentation

## 2019-01-04 DIAGNOSIS — J45909 Unspecified asthma, uncomplicated: Secondary | ICD-10-CM | POA: Insufficient documentation

## 2019-01-04 DIAGNOSIS — R1084 Generalized abdominal pain: Secondary | ICD-10-CM | POA: Insufficient documentation

## 2019-01-04 HISTORY — PX: HYSTEROSCOPY WITH D & C: SHX1775

## 2019-01-04 LAB — POCT PREGNANCY, URINE: Preg Test, Ur: NEGATIVE

## 2019-01-04 LAB — TYPE AND SCREEN
ABO/RH(D): A POS
Antibody Screen: NEGATIVE

## 2019-01-04 SURGERY — DILATATION AND CURETTAGE /HYSTEROSCOPY
Anesthesia: General

## 2019-01-04 MED ORDER — PROPOFOL 10 MG/ML IV BOLUS
INTRAVENOUS | Status: DC | PRN
  Administered 2019-01-04: 140 mg via INTRAVENOUS
  Administered 2019-01-04: 30 mg via INTRAVENOUS

## 2019-01-04 MED ORDER — PHENYLEPHRINE HCL (PRESSORS) 10 MG/ML IV SOLN
INTRAVENOUS | Status: DC | PRN
  Administered 2019-01-04 (×3): 100 ug via INTRAVENOUS

## 2019-01-04 MED ORDER — KETOROLAC TROMETHAMINE 30 MG/ML IJ SOLN: 30.0000 mg | Freq: Once | INTRAMUSCULAR | Status: DC | PRN

## 2019-01-04 MED ORDER — GLYCOPYRROLATE 0.2 MG/ML IJ SOLN
INTRAMUSCULAR | Status: DC | PRN
  Administered 2019-01-04: 0.2 mg via INTRAVENOUS

## 2019-01-04 MED ORDER — ACETAMINOPHEN NICU IV SYRINGE 10 MG/ML
INTRAVENOUS | Status: AC
  Filled 2019-01-04: qty 1

## 2019-01-04 MED ORDER — MIDAZOLAM HCL 2 MG/2ML IJ SOLN
INTRAMUSCULAR | Status: AC
  Filled 2019-01-04: qty 2

## 2019-01-04 MED ORDER — MEPERIDINE HCL 50 MG/ML IJ SOLN: 6.2500 mg | INTRAMUSCULAR | Status: DC | PRN

## 2019-01-04 MED ORDER — FENTANYL CITRATE (PF) 100 MCG/2ML IJ SOLN
INTRAMUSCULAR | Status: AC
  Filled 2019-01-04: qty 2

## 2019-01-04 MED ORDER — HYDROCODONE-ACETAMINOPHEN 5-325 MG PO TABS: 1.0000 | ORAL_TABLET | Freq: Four times a day (QID) | ORAL | 0 refills | Status: DC | PRN

## 2019-01-04 MED ORDER — FENTANYL CITRATE (PF) 100 MCG/2ML IJ SOLN: 25.0000 ug | INTRAMUSCULAR | Status: DC | PRN

## 2019-01-04 MED ORDER — FENTANYL CITRATE (PF) 100 MCG/2ML IJ SOLN
INTRAMUSCULAR | Status: DC | PRN
  Administered 2019-01-04 (×4): 25 ug via INTRAVENOUS

## 2019-01-04 MED ORDER — ACETAMINOPHEN 325 MG PO TABS: 325.0000 mg | ORAL_TABLET | ORAL | Status: DC | PRN

## 2019-01-04 MED ORDER — KETOROLAC TROMETHAMINE 30 MG/ML IJ SOLN
INTRAMUSCULAR | Status: DC | PRN
  Administered 2019-01-04: 30 mg via INTRAVENOUS

## 2019-01-04 MED ORDER — HYDROCODONE-ACETAMINOPHEN 7.5-325 MG PO TABS
1.0000 | ORAL_TABLET | Freq: Once | ORAL | Status: DC | PRN
  Filled 2019-01-04: qty 1

## 2019-01-04 MED ORDER — FAMOTIDINE 20 MG PO TABS
20.0000 mg | ORAL_TABLET | Freq: Once | ORAL | Status: AC
  Administered 2019-01-04: 20 mg via ORAL

## 2019-01-04 MED ORDER — PROPOFOL 10 MG/ML IV BOLUS
INTRAVENOUS | Status: AC
  Filled 2019-01-04: qty 20

## 2019-01-04 MED ORDER — DEXAMETHASONE SODIUM PHOSPHATE 10 MG/ML IJ SOLN
INTRAMUSCULAR | Status: DC | PRN
  Administered 2019-01-04: 10 mg via INTRAVENOUS

## 2019-01-04 MED ORDER — PROMETHAZINE HCL 25 MG/ML IJ SOLN: 6.2500 mg | INTRAMUSCULAR | Status: DC | PRN

## 2019-01-04 MED ORDER — MIDAZOLAM HCL 2 MG/2ML IJ SOLN
INTRAMUSCULAR | Status: DC | PRN
  Administered 2019-01-04: 2 mg via INTRAVENOUS

## 2019-01-04 MED ORDER — LIDOCAINE HCL (PF) 2 % IJ SOLN
INTRAMUSCULAR | Status: AC
  Filled 2019-01-04: qty 10

## 2019-01-04 MED ORDER — ACETAMINOPHEN 10 MG/ML IV SOLN
INTRAVENOUS | Status: DC | PRN
  Administered 2019-01-04: 1000 mg via INTRAVENOUS

## 2019-01-04 MED ORDER — LIDOCAINE HCL (CARDIAC) PF 100 MG/5ML IV SOSY
PREFILLED_SYRINGE | INTRAVENOUS | Status: DC | PRN
  Administered 2019-01-04: 60 mg via INTRAVENOUS

## 2019-01-04 MED ORDER — ONDANSETRON HCL 4 MG/2ML IJ SOLN
INTRAMUSCULAR | Status: DC | PRN
  Administered 2019-01-04: 4 mg via INTRAVENOUS

## 2019-01-04 MED ORDER — SILVER NITRATE-POT NITRATE 75-25 % EX MISC
CUTANEOUS | Status: DC | PRN
  Administered 2019-01-04: 4 via TOPICAL

## 2019-01-04 MED ORDER — FAMOTIDINE 20 MG PO TABS
ORAL_TABLET | ORAL | Status: AC
  Filled 2019-01-04: qty 1

## 2019-01-04 MED ORDER — ACETAMINOPHEN 160 MG/5ML PO SOLN
325.0000 mg | ORAL | Status: DC | PRN
  Filled 2019-01-04: qty 20.3

## 2019-01-04 MED ORDER — LACTATED RINGERS IV SOLN
INTRAVENOUS | Status: DC
  Administered 2019-01-04 (×2): via INTRAVENOUS

## 2019-01-04 SURGICAL SUPPLY — 27 items
BAG URINE DRAIN 2000ML AR STRL (UROLOGICAL SUPPLIES) IMPLANT
CANISTER SUCT 3000ML PPV (MISCELLANEOUS) ×3 IMPLANT
CATH FOLEY 2WAY  5CC 16FR (CATHETERS)
CATH ROBINSON RED A/P 16FR (CATHETERS) ×3 IMPLANT
CATH URTH 16FR FL 2W BLN LF (CATHETERS) IMPLANT
COVER PROBE FLX POLY STRL (MISCELLANEOUS) ×4 IMPLANT
COVER WAND RF STERILE (DRAPES) ×1 IMPLANT
DEVICE MYOSURE LITE (MISCELLANEOUS) ×2 IMPLANT
ELECT REM PT RETURN 9FT ADLT (ELECTROSURGICAL) ×3
ELECTRODE REM PT RTRN 9FT ADLT (ELECTROSURGICAL) ×1 IMPLANT
GLOVE BIO SURGEON STRL SZ7 (GLOVE) ×3 IMPLANT
GLOVE BIOGEL PI IND STRL 7.5 (GLOVE) ×1 IMPLANT
GLOVE BIOGEL PI INDICATOR 7.5 (GLOVE) ×2
GOWN STRL REUS W/ TWL LRG LVL3 (GOWN DISPOSABLE) ×2 IMPLANT
GOWN STRL REUS W/TWL LRG LVL3 (GOWN DISPOSABLE) ×4
IV LACTATED RINGERS 1000ML (IV SOLUTION) ×3 IMPLANT
IV NS IRRIG 3000ML ARTHROMATIC (IV SOLUTION) ×2 IMPLANT
KIT PROCEDURE FLUENT (KITS) ×2 IMPLANT
KIT TURNOVER CYSTO (KITS) ×3 IMPLANT
MYOSURE XL FIBROID (MISCELLANEOUS)
PACK DNC HYST (MISCELLANEOUS) ×3 IMPLANT
PAD OB MATERNITY 4.3X12.25 (PERSONAL CARE ITEMS) ×3 IMPLANT
PAD PREP 24X41 OB/GYN DISP (PERSONAL CARE ITEMS) ×3 IMPLANT
SEAL ROD LENS SCOPE MYOSURE (ABLATOR) ×3 IMPLANT
SYSTEM TISS REMOVAL MYOSURE XL (MISCELLANEOUS) IMPLANT
TUBING CONNECTING 10 (TUBING) ×2 IMPLANT
TUBING CONNECTING 10' (TUBING) ×1

## 2019-01-04 NOTE — Anesthesia Procedure Notes (Signed)
Procedure Name: LMA Insertion Date/Time: 01/04/2019 1:20 PM Performed by: Willette Alma, CRNA Pre-anesthesia Checklist: Patient identified, Patient being monitored, Timeout performed, Emergency Drugs available and Suction available Patient Re-evaluated:Patient Re-evaluated prior to induction Oxygen Delivery Method: Circle system utilized Preoxygenation: Pre-oxygenation with 100% oxygen Induction Type: IV induction Ventilation: Mask ventilation without difficulty LMA: LMA inserted LMA Size: 4.0 Tube type: Oral Number of attempts: 1 Placement Confirmation: positive ETCO2 and breath sounds checked- equal and bilateral Tube secured with: Tape Dental Injury: Teeth and Oropharynx as per pre-operative assessment

## 2019-01-04 NOTE — Op Note (Signed)
Operative Note   01/04/2019  PRE-OP DIAGNOSIS: Uterine Mass   POST-OP DIAGNOSIS: Uterine Mass   SURGEON: Surgeon(s) and Role:    * Will Bonnet, MD - Primary  ASSISTANT: Humberto Leep, MD  PROCEDURE: Procedure(s): 1) Hysteroscopy 2) Dilation and curettage 3) intra-operative use of ultrasound to aid in surgery  ANESTHESIA: General  ESTIMATED BLOOD LOSS: 25 mL  DRAINS: none   TOTAL IV FLUIDS: 800 mL  SPECIMENS:  Endometrial sampling  VTE PROPHYLAXIS: SCDs to the bilateral lower extremities  ANTIBIOTICS: none indicated, none given  FLUID DEFICIT: [id number] ml  COMPLICATIONS: none  DISPOSITION: PACU - hemodynamically stable.  CONDITION: stable  INDICATION: 33 y.o. [id number] female who presented to the ER earlier this month for abdominal pain. She had an incidentally noted uterine mass.  She wanted removal and diagnosis.   FINDINGS: Exam under anesthesia revealed small, mobile anteverted uterus and bilateral adnexa without masses or fullness. Hysteroscopy revealed an obliterated endometrial cavity. Unable to distinguish a mass in particular.   PROCEDURE IN DETAIL:  After informed consent was obtained, the patient was taken to the operating room where anesthesia was obtained without difficulty. The patient was positioned in the dorsal lithotomy position in candy cane stirrups.  The patient's bladder was catheterized with an in and out foley catheter.  The patient was examined under anesthesia, with the above noted findings.  The bi-valved speculum was placed inside the patient's vagina, and the the anterior lip of the cervix was seen and grasped with the tenaculum.  The cervix was progressively dilated to a 7 mm Hegar dilator.  The hysteroscope was introduced, with the above noted findings.  At this point there appeared to be a fluid deficit that did not appear to correlate to the findings.  The cavity appeared to be a blind one. The cavity was narrow.  There was no significant  retrograde flow of fluid from the cervix and vagina.    Dr. Georgianne Fick was called into the OR to perform a bedside ultrasound to assist in ensuring the surgery would progress safely and to provide direction on where to advance the hysteroscope.    The hysteroscope appeared to be at the uterine fundus, even though the depth of the scope was only about 5 cm.  Ultrasound confirmed that the hysteroscope was in the endometrial canal and had no perforated the uterus.  At this point, given the obliterated nature of the uterine cavity, the decision was made to sample the walls of the cavity.  No hematometra was noted.  Using the MyoSure hysteroscope a sampling of the lining was taken. A curette was also used to obtain more of a sample. This was all done under ultrasound guidance.  At this point the procedure was terminated.    The hystersocope was removed. Hemostasis was noted, and all instruments were removed, with excellent hemostasis noted throughout after application of silver nitrate to the tenaculum entry sites.  The vagina was inspected and it was verified that no instruments were left in the vagina.  She was then taken out of dorsal lithotomy.  The patient tolerated the procedure well.  Sponge, lap and needle counts were correct x2.  The patient was taken to recovery room in excellent condition.  Will Bonnet, MD, [place] 01/04/2019 3:07 PM

## 2019-01-04 NOTE — Transfer of Care (Signed)
Immediate Anesthesia Transfer of Care Note  Patient: Lindsey Cain  Procedure(s) Performed: DILATATION AND CURETTAGE /HYSTEROSCOPY, REMOVAL OF UTERINE MASS (N/A )  Patient Location: PACU  Anesthesia Type:General  Level of Consciousness: awake and alert   Airway & Oxygen Therapy: Patient Spontanous Breathing and Patient connected to face mask oxygen  Post-op Assessment: Report given to RN and Post -op Vital signs reviewed and stable  Post vital signs: Reviewed and stable  Last Vitals:  Vitals Value Taken Time  BP 109/78 01/04/19 1514  Temp 36.1 C 01/04/19 1514  Pulse 162 01/04/19 1518  Resp 20 01/04/19 1517  SpO2 100 % 01/04/19 1518  Vitals shown include unvalidated device data.  Last Pain:  Vitals:   01/04/19 1113  TempSrc: Tympanic  PainSc: 0-No pain         Complications: No apparent anesthesia complications

## 2019-01-04 NOTE — Interval H&P Note (Signed)
History and Physical Interval Note:  01/04/2019 1:04 PM  Lindsey Cain  has presented today for surgery, with the diagnosis of Uterine mass N85.8.  The various methods of treatment have been discussed with the patient and family. After consideration of risks, benefits and other options for treatment, the patient has consented to  Procedure(s): DILATATION AND CURETTAGE /HYSTEROSCOPY, REMOVAL OF UTERINE MASS (N/A) as a surgical intervention.  The patient's history has been reviewed, patient examined, no change in status, stable for surgery.  I have reviewed the patient's chart and labs.  Questions were answered to the patient's satisfaction.  Consents reviewed and signed and I personally consented the patient. She agrees to proceed.  Prentice Docker, MD, [facility], [facility] 01/04/2019 1:05 PM

## 2019-01-04 NOTE — Anesthesia Postprocedure Evaluation (Signed)
Anesthesia Post Note  Patient: Lindsey Cain  Procedure(s) Performed: DILATATION AND CURETTAGE /HYSTEROSCOPY, REMOVAL OF UTERINE MASS (N/A )  Patient location during evaluation: PACU Anesthesia Type: General Level of consciousness: awake and alert Pain management: pain level controlled Vital Signs Assessment: post-procedure vital signs reviewed and stable Respiratory status: spontaneous breathing, nonlabored ventilation, respiratory function stable and patient connected to nasal cannula oxygen Cardiovascular status: blood pressure returned to baseline and stable Postop Assessment: no apparent nausea or vomiting Anesthetic complications: no     Last Vitals:  Vitals:   01/04/19 1609 01/04/19 1635  BP: (P) 114/77 124/74  Pulse: (P) 72 76  Resp: (P) 18 18  Temp: (P) 36.7 C   SpO2:  100%    Last Pain:  Vitals:   01/04/19 1635  TempSrc:   PainSc: 0-No pain                 Lakeisa Heninger S

## 2019-01-04 NOTE — Anesthesia Preprocedure Evaluation (Signed)
Anesthesia Evaluation  Patient identified by MRN, date of birth, ID band Patient awake    Reviewed: Allergy & Precautions, H&P , NPO status , reviewed documented beta blocker date and time   Airway Mallampati: II  TM Distance: >3 FB Neck ROM: full    Dental  (+) Chipped   Pulmonary asthma , Not current smoker,  No active asthma   Pulmonary exam normal        Cardiovascular Normal cardiovascular exam     Neuro/Psych    GI/Hepatic   Endo/Other    Renal/GU      Musculoskeletal   Abdominal   Peds  Hematology  (+) Blood dyscrasia, anemia ,   Anesthesia Other Findings Past Medical History: No date: Anemia No date: Asthma 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 Past Surgical History: 10/22/2015: DILATION AND CURETTAGE OF UTERUS; N/A     Comment:  Procedure: DILATATION AND CURETTAGE;  Surgeon: Gae Dry, MD;  Location: [facility] ORS;  Service: Gynecology;                Laterality: N/A; BMI    Body Mass Index: 21.41 kg/m     Reproductive/Obstetrics                             Anesthesia Physical Anesthesia Plan  ASA: II  Anesthesia Plan: General LMA   Post-op Pain Management:    Induction: Intravenous  PONV Risk Score and Plan: 3 and Ondansetron, Dexamethasone, Midazolam and Treatment may vary due to age or medical condition  Airway Management Planned: LMA  Additional Equipment:   Intra-op Plan:   Post-operative Plan: Extubation in OR  Informed Consent: I have reviewed the patients History and Physical, chart, labs and discussed the procedure including the risks, benefits and alternatives for the proposed anesthesia with the patient or authorized representative who has indicated his/her understanding and acceptance.     Dental Advisory Given  Plan Discussed with: CRNA  Anesthesia Plan Comments:         Anesthesia Quick Evaluation

## 2019-01-04 NOTE — Anesthesia Post-op Follow-up Note (Signed)
Anesthesia QCDR form completed.        

## 2019-01-05 ENCOUNTER — Encounter: Payer: Self-pay | Admitting: Obstetrics and Gynecology

## 2019-01-08 LAB — SURGICAL PATHOLOGY

## 2019-01-19 ENCOUNTER — Ambulatory Visit: Payer: Self-pay | Admitting: Obstetrics and Gynecology

## 2019-02-06 ENCOUNTER — Ambulatory Visit (INDEPENDENT_AMBULATORY_CARE_PROVIDER_SITE_OTHER): Payer: Self-pay | Admitting: Obstetrics and Gynecology

## 2019-02-06 ENCOUNTER — Other Ambulatory Visit: Payer: Self-pay

## 2019-02-06 VITALS — BP 122/74 | Ht 66.0 in | Wt 136.0 lb

## 2019-02-06 DIAGNOSIS — N856 Intrauterine synechiae: Secondary | ICD-10-CM

## 2019-02-06 NOTE — Progress Notes (Signed)
   Postoperative Follow-up Patient presents post op from hysteroscopy, D&C 5weeks ago for uterine mass.  Subjective: Patient reports some improvement in her preop symptoms. Eating a regular diet without difficulty. The patient is not having any pain.  Activity: normal activities of daily living.  Objective: Vitals:   02/06/19 1551  BP: 122/74   Vital Signs: BP 122/74   Ht 5\' 6"  (1.676 m)   Wt 136 lb (61.7 kg)   BMI 21.95 kg/m  Constitutional: Well nourished, well developed female in no acute distress.  HEENT: normal Skin: Warm and dry.  Extremity: no edema    Assessment: 33 y.o. 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  I have discussed the post-operative course to date, and the expected progress moving forward.  The patient understands what complications to be concerned about.  I will see the patient in routine follow up, or sooner if needed.    Activity plan: No restriction.  Discussed options for treating Asherman syndrome.  Discussed she could do nothing at this time.  Discussed referral to reproductive endocrinology and infertility specialist for potential surgical management, as this is beyond my scope of practice.  Discussed MRI for further characterization of her uterus.  Discussed hysterectomy as definitive management.  She is unsure how to proceed and would like time to consider.  All questions answered.  Prentice Docker, MD 02/06/2019, 4:12 PM

## 2019-12-27 ENCOUNTER — Emergency Department
Admission: EM | Admit: 2019-12-27 | Discharge: 2019-12-27 | Disposition: A | Discharge status: Home / Self Care | Payer: Self-pay | Attending: Emergency Medicine | Admitting: Emergency Medicine

## 2019-12-27 ENCOUNTER — Encounter: Payer: Self-pay | Admitting: Emergency Medicine

## 2019-12-27 ENCOUNTER — Emergency Department: Payer: Self-pay

## 2019-12-27 ENCOUNTER — Other Ambulatory Visit: Payer: Self-pay

## 2019-12-27 DIAGNOSIS — R079 Chest pain, unspecified: Secondary | ICD-10-CM | POA: Insufficient documentation

## 2019-12-27 DIAGNOSIS — Z5321 Procedure and treatment not carried out due to patient leaving prior to being seen by health care provider: Secondary | ICD-10-CM | POA: Insufficient documentation

## 2019-12-27 LAB — BASIC METABOLIC PANEL
Anion gap: 8 (ref 5–15)
BUN: 10 mg/dL (ref 6–20)
CO2: 25 mmol/L (ref 22–32)
Calcium: 9.2 mg/dL (ref 8.9–10.3)
Chloride: 103 mmol/L (ref 98–111)
Creatinine, Ser: 0.74 mg/dL (ref 0.44–1.00)
GFR, Estimated: >60 mL/min (ref >60)
Glucose, Bld: 93 mg/dL (ref 70–99)
Potassium: 3.7 mmol/L (ref 3.5–5.1)
Sodium: 136 mmol/L (ref 135–145)

## 2019-12-27 LAB — CBC
HCT: 34.5 % — ABNORMAL LOW (ref 36.0–46.0)
Hemoglobin: 11.5 g/dL — ABNORMAL LOW (ref 12.0–15.0)
MCH: 28.5 pg (ref 26.0–34.0)
MCHC: 33.3 g/dL (ref 30.0–36.0)
MCV: 85.6 fL (ref 80.0–100.0)
Platelets: 129 10*3/uL — ABNORMAL LOW (ref 150–400)
RBC: 4.03 MIL/uL (ref 3.87–5.11)
RDW: 12.5 % (ref 11.5–15.5)
WBC: 4.7 10*3/uL (ref 4.0–10.5)
nRBC: 0 % (ref 0.0–0.2)

## 2019-12-27 LAB — TROPONIN I (HIGH SENSITIVITY)
Troponin I (High Sensitivity): <2 ng/L (ref <18)
Troponin I (High Sensitivity): <2 ng/L (ref <18)

## 2019-12-27 LAB — HCG, QUANTITATIVE, PREGNANCY: hCG, Beta Chain, Quant, S: 1176 m[IU]/mL — ABNORMAL HIGH (ref <5)

## 2019-12-27 LAB — POC URINE PREG, ED: Preg Test, Ur: POSITIVE — AB

## 2019-12-27 NOTE — ED Triage Notes (Signed)
Pt comes into the ED via POV c/o central chest pain that started suddenly last night.  Pt denies any radiation of pain, N/V/ dizziness, SHOB.  Pt ambulatory to triage, able to speak in full sentences, and denies a cardiac history.  Pt states the pain is a squeezing cramping feeling that gets worse with inhalation.

## 2019-12-31 ENCOUNTER — Telehealth: Payer: Self-pay | Admitting: Emergency Medicine

## 2019-12-31 NOTE — Telephone Encounter (Signed)
Called patient due to lwot to inquire about condition and follow up plans.No answer and voicemail is full. °

## 2019-12-31 NOTE — Telephone Encounter (Signed)
Patient called me back in response to missed call.   I asked if she was going to follow up for her symptoms she came to the ED with.  She says she is going to have to call me back.   Says she is at work.

## 2021-03-30 ENCOUNTER — Encounter: Payer: Self-pay | Admitting: Oncology

## 2021-03-30 ENCOUNTER — Encounter: Payer: Self-pay | Admitting: Emergency Medicine

## 2021-03-30 ENCOUNTER — Other Ambulatory Visit: Payer: Self-pay

## 2021-03-30 ENCOUNTER — Emergency Department
Admission: EM | Admit: 2021-03-30 | Discharge: 2021-03-30 | Disposition: A | Discharge status: Home / Self Care | Payer: Self-pay | Attending: Emergency Medicine | Admitting: Emergency Medicine

## 2021-03-30 DIAGNOSIS — S61012A Laceration without foreign body of left thumb without damage to nail, initial encounter: Secondary | ICD-10-CM | POA: Insufficient documentation

## 2021-03-30 DIAGNOSIS — Z23 Encounter for immunization: Secondary | ICD-10-CM | POA: Insufficient documentation

## 2021-03-30 DIAGNOSIS — Y990 Civilian activity done for income or pay: Secondary | ICD-10-CM | POA: Insufficient documentation

## 2021-03-30 DIAGNOSIS — W268XXA Contact with other sharp object(s), not elsewhere classified, initial encounter: Secondary | ICD-10-CM | POA: Insufficient documentation

## 2021-03-30 MED ORDER — TETANUS-DIPHTH-ACELL PERTUSSIS 5-2.5-18.5 LF-MCG/0.5 IM SUSY
0.5000 mL | PREFILLED_SYRINGE | Freq: Once | INTRAMUSCULAR | Status: AC
  Administered 2021-03-30: 0.5 mL via INTRAMUSCULAR
  Filled 2021-03-30: qty 0.5

## 2021-03-30 NOTE — ED Notes (Signed)
Not certain about tetnus

## 2021-03-30 NOTE — ED Triage Notes (Signed)
Pt with laceration with controlled bleedin gnoted to lef thumb. Pt states is from grill at work, declines to file worker's comp

## 2021-03-30 NOTE — Discharge Instructions (Addendum)
Keep area clean and dry.  Watch for any signs of infection such as redness, fever or pus.  The adhesive that was applied to your skin will fall off on its own.  You may take Tylenol if needed for pain.

## 2021-03-30 NOTE — ED Provider Notes (Signed)
[facility] Provider Note    Event Date/Time   First MD Initiated Contact with Patient 03/30/21 [phone]     (approximate)   History   Laceration   HPI  Lindsey Cain is a 36 y.o. female   presents to the ED with complaint of laceration to her left thumb that occurred while she was at work.  Patient states that she was cleaning a metal grill when this happened.  She is unsure of her last tetanus but is reasonably sure that is been over 5 years.  Patient has a history of anemia and asthma.  Rates her pain as a 0/10.      Physical Exam   Triage Vital Signs: ED Triage Vitals  Enc Vitals Group     BP 03/30/21 0659 (!) 123/101     Pulse Rate 03/30/21 0659 (!) 101     Resp 03/30/21 0659 16     Temp --      Temp src --      SpO2 03/30/21 0659 96 %     Weight 03/30/21 0657 130 lb (59 kg)     Height 03/30/21 0657 5\' 7"  (1.702 m)     Head Circumference --      Peak Flow --      Pain Score 03/30/21 0657 0     Pain Loc --      Pain Edu? --      Excl. in [place]? --     Most recent vital signs: Vitals:   03/30/21 0659 03/30/21 0757  BP: (!) 123/101 127/82  Pulse: (!) 101 89  Resp: 16 16  SpO2: 96% 99%     General: Awake, no distress.  CV:  Good peripheral perfusion.  Resp:  Normal effort.  Other:  On examination of the left thumb there is a laceration at the base of the lthumb medial aspect without active bleeding.  Range of motion with flexion and extension is without restriction.  Capillary refills less than 3 seconds.  Motor sensory function intact.   ED Results / Procedures / Treatments   Labs (all labs ordered are listed, but only abnormal results are displayed) Labs Reviewed - No data to display    PROCEDURES:  Critical Care performed: No  ..Laceration Repair  Date/Time: 03/30/2021 8:05 AM Performed by: Johnn Hai, PA-C Authorized by: Johnn Hai, PA-C   Consent:    Consent obtained:  Verbal   Consent given by:   Patient   Risks discussed:  Infection Universal protocol:    Patient identity confirmed:  Verbally with patient Anesthesia:    Anesthesia method:  None Laceration details:    Location:  Finger   Finger location:  L thumb   Length (cm):  1 Exploration:    Limited defect created (wound extended): no     Hemostasis achieved with:  Direct pressure   Contaminated: no   Treatment:    Area cleansed with:  Saline   Amount of cleaning:  Standard   Irrigation solution:  Sterile saline Skin repair:    Repair method:  Tissue adhesive Repair type:    Repair type:  Simple Post-procedure details:    Dressing:  Open (no dressing)   Procedure completion:  Tolerated   MEDICATIONS ORDERED IN ED: Medications  Tdap (BOOSTRIX) injection 0.5 mL (0.5 mLs Intramuscular Given 03/30/21 0735)     IMPRESSION / MDM / [place] / ED COURSE  I reviewed the triage vital signs  and the nursing notes.   Differential diagnosis includes, but is not limited to, laceration left thumb, abrasion.  36 year old female presents to the ED with laceration to her left thumb that occurred while at work cleaning a metal tray.  Patient was uncertain of her last tetanus and received 1 while in the ED.  Blood pressure initially was elevated however prior to discharge patient blood pressure recheck was 127/82.  Area was cleaned with normal saline, no active bleeding, no evidence of foreign body and Dermabond applied.  Patient given instructions on care of this area.      FINAL CLINICAL IMPRESSION(S) / ED DIAGNOSES   Final diagnoses:  Laceration of left thumb without foreign body without damage to nail, initial encounter     Rx / DC Orders   ED Discharge Orders     None        Note:  This document was prepared using Dragon voice recognition software and may include unintentional dictation errors.   Johnn Hai, PA-C 03/30/21 0911    Vanessa [place], MD 03/30/21 [phone]

## 2021-03-30 NOTE — ED Notes (Signed)
Pt has small lac on r thunb. Not bleeding. Dressing changed. Pt stable.

## 2021-06-05 IMAGING — US US PELVIS COMPLETE WITH TRANSVAGINAL
1 series · 13 of 25 positions shown · non-contrast
Comparison: CT of earlier today.

CLINICAL DATA: Lower abdominal pain last night. Abnormal
endometrium on CT.

EXAM:
TRANSABDOMINAL AND TRANSVAGINAL ULTRASOUND OF PELVIS
TECHNIQUE: Both transabdominal and transvaginal ultrasound examinations of the
pelvis were performed. Transabdominal technique was performed for
global imaging of the pelvis including uterus, ovaries, adnexal
regions, and pelvic cul-de-sac. It was necessary to proceed with
endovaginal exam following the transabdominal exam to visualize the
uterus, ovaries, and adnexa.

[Series 1: us pelvis complete with transvaginal · 13 of 103 slices shown]
[im 1/103]
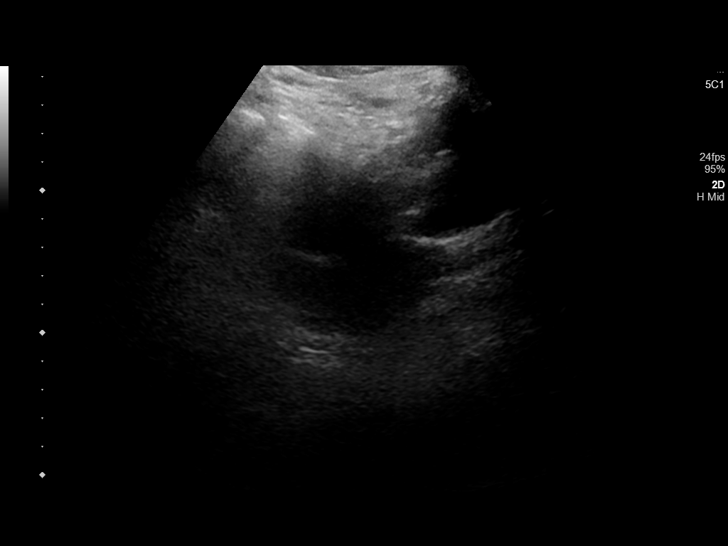
[im 9/103]
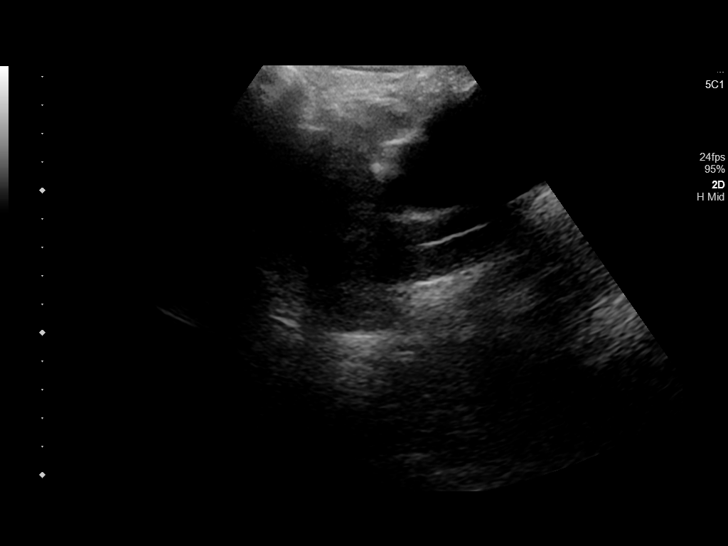
[im 18/103]
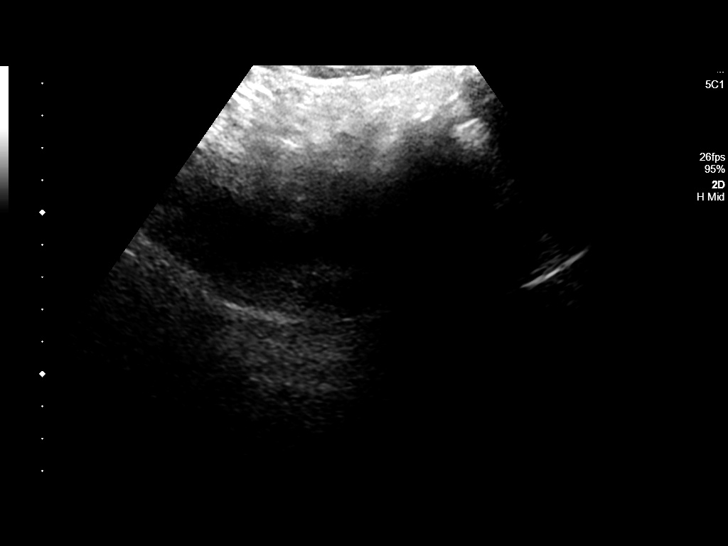
[im 26/103]
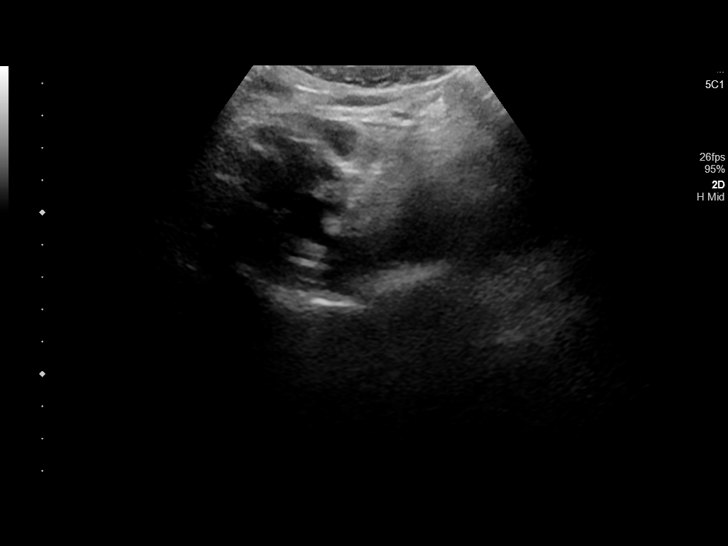
[im 35/103]
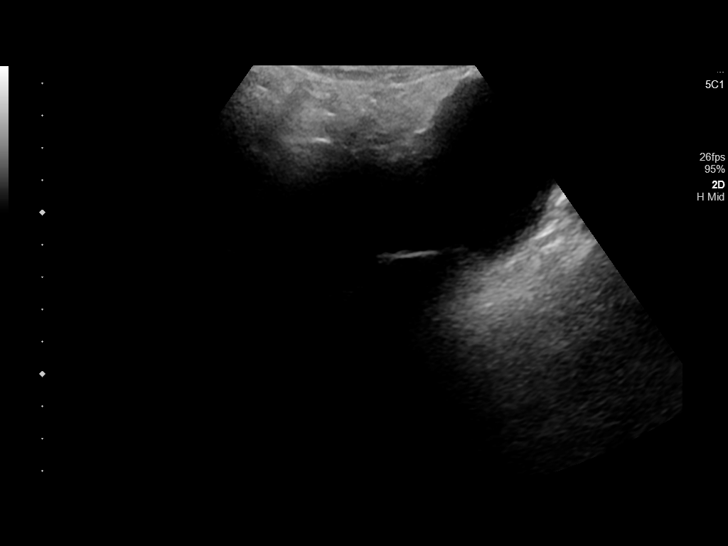
[im 43/103]
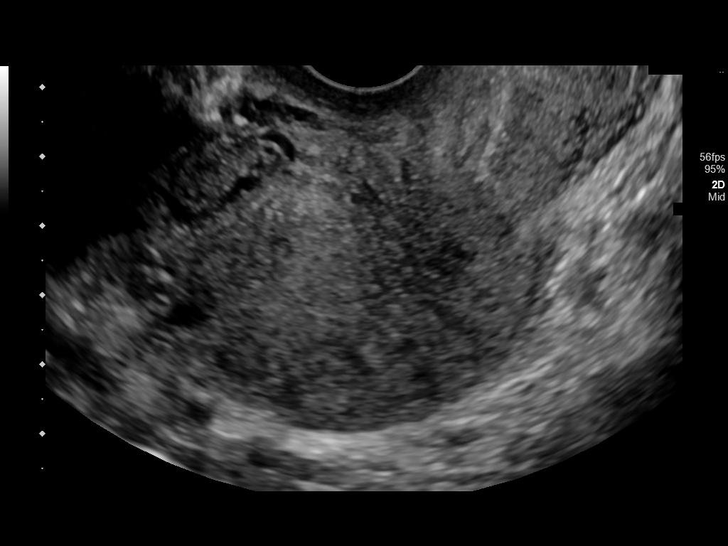
[im 52/103]
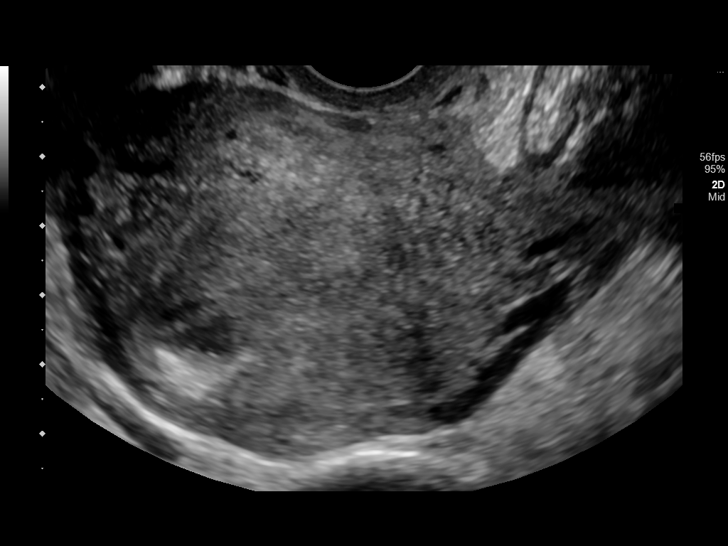
[im 60/103]
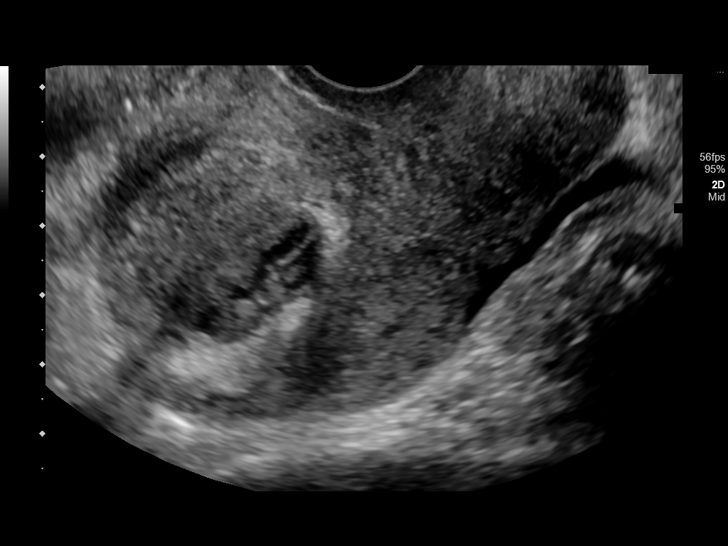
[im 69/103]
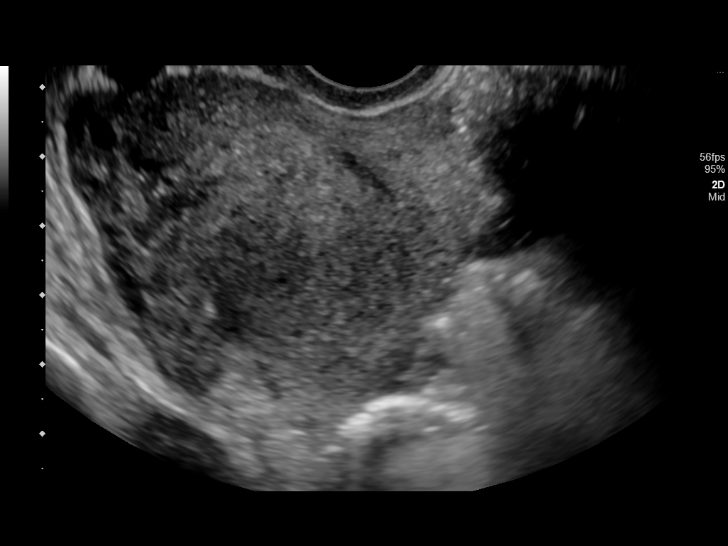
[im 77/103]
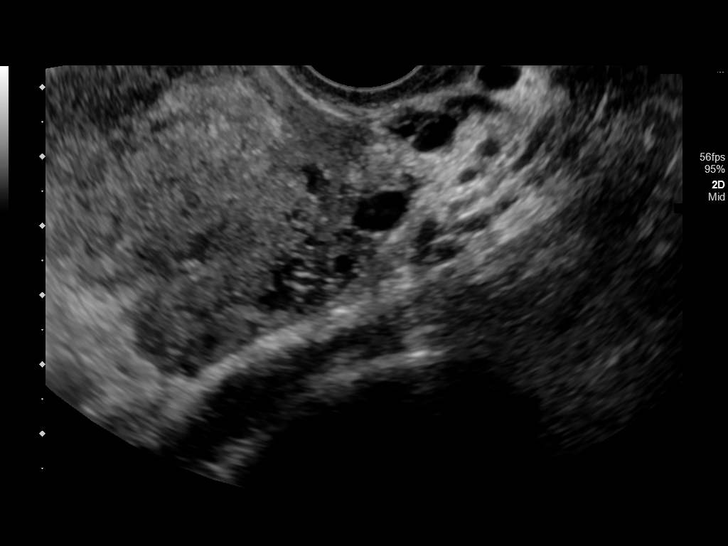
[im 86/103]
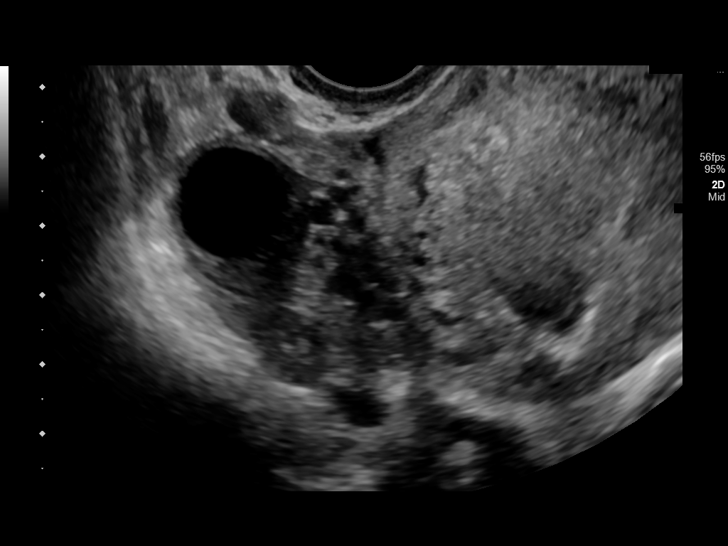
[im 94/103]
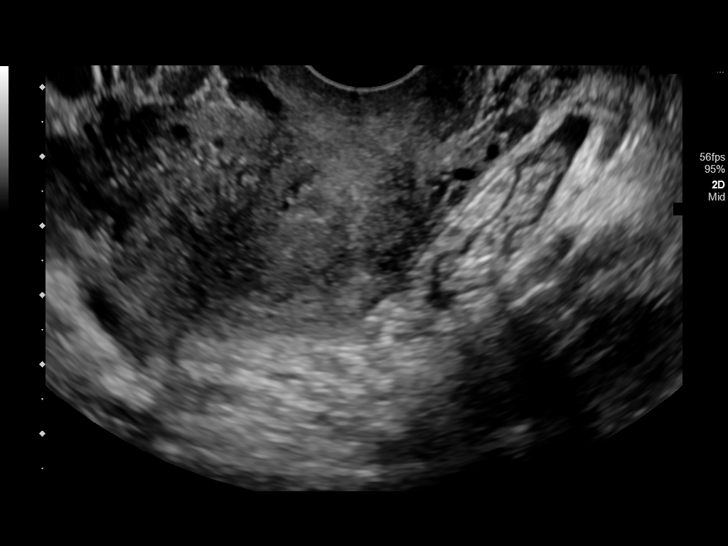
[im 103/103]
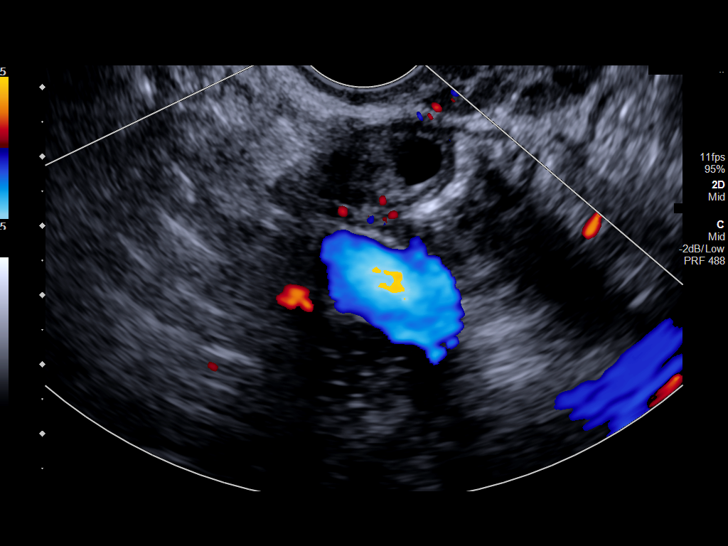

[13 of 25 positions shown; findings below may reference images not displayed]

FINDINGS: Uterus

Measurements: 7.4 x 5.8 x 6.7 cm = volume: 151 mL. No myometrial
lesion.

Endometrium

Within the central uterine fundus, a vascular lesion measures on the
order of 3.3 x 2.6 x 3.2 cm. Example including on image 71 and 66.

Right ovary

Measurements: 4.1 x 2.8 x 2.0 cm = volume: 12 mL. Normal
appearance/no adnexal mass.

Left ovary

Measurements: 2.2 x 1.2 x 1.8 cm = volume: 2.4 mL. Normal
appearance/no adnexal mass.

Other findings

Trace free pelvic fluid is likely physiologic.
IMPRESSION: Vascular central uterine mass at the fundus could represent an
endometrial polyp or a submucosal fibroid. Potential clinical
strategies include sonohysterogram or nonemergent outpatient pelvic
MRI.

## 2021-10-24 ENCOUNTER — Other Ambulatory Visit: Payer: Self-pay

## 2021-10-24 ENCOUNTER — Emergency Department
Admission: EM | Admit: 2021-10-24 | Discharge: 2021-10-24 | Disposition: A | Discharge status: Home / Self Care | Payer: Medicaid Other | Attending: Emergency Medicine | Admitting: Emergency Medicine

## 2021-10-24 ENCOUNTER — Emergency Department: Payer: Medicaid Other

## 2021-10-24 ENCOUNTER — Encounter: Payer: Self-pay | Admitting: Emergency Medicine

## 2021-10-24 DIAGNOSIS — E876 Hypokalemia: Secondary | ICD-10-CM | POA: Insufficient documentation

## 2021-10-24 DIAGNOSIS — R0789 Other chest pain: Secondary | ICD-10-CM | POA: Insufficient documentation

## 2021-10-24 LAB — TSH: TSH: 1.256 u[IU]/mL (ref 0.350–4.500)

## 2021-10-24 LAB — CBC
HCT: 37.9 % (ref 36.0–46.0)
Hemoglobin: 12.2 g/dL (ref 12.0–15.0)
MCH: 28.3 pg (ref 26.0–34.0)
MCHC: 32.2 g/dL (ref 30.0–36.0)
MCV: 87.9 fL (ref 80.0–100.0)
Platelets: 209 10*3/uL (ref 150–400)
RBC: 4.31 MIL/uL (ref 3.87–5.11)
RDW: 12.8 % (ref 11.5–15.5)
WBC: 5.8 10*3/uL (ref 4.0–10.5)
nRBC: 0 % (ref 0.0–0.2)

## 2021-10-24 LAB — BASIC METABOLIC PANEL
Anion gap: 5 (ref 5–15)
BUN: 8 mg/dL (ref 6–20)
CO2: 29 mmol/L (ref 22–32)
Calcium: 8.9 mg/dL (ref 8.9–10.3)
Chloride: 106 mmol/L (ref 98–111)
Creatinine, Ser: 0.94 mg/dL (ref 0.44–1.00)
GFR, Estimated: >60 mL/min (ref >60)
Glucose, Bld: 109 mg/dL — ABNORMAL HIGH (ref 70–99)
Potassium: 3.2 mmol/L — ABNORMAL LOW (ref 3.5–5.1)
Sodium: 140 mmol/L (ref 135–145)

## 2021-10-24 LAB — TROPONIN I (HIGH SENSITIVITY)
Troponin I (High Sensitivity): <2 ng/L (ref <18)
Troponin I (High Sensitivity): <2 ng/L (ref <18)

## 2021-10-24 LAB — D-DIMER, QUANTITATIVE: D-Dimer, Quant: 0.38 ug/mL-FEU (ref 0.00–0.50)

## 2021-10-24 NOTE — ED Notes (Signed)
Verbal consent for dc provided.

## 2021-10-24 NOTE — ED Provider Notes (Signed)
[facility] Provider Note    Event Date/Time   First MD Initiated Contact with Patient 10/24/21 1725     (approximate)   History   Chest Pain   HPI  Lindsey Cain is a 36 y.o. female with a past medical history of uterine mass, and endomyometritis who presents today for evaluation of left-sided chest pain.  She reports that this began last night with some mild associated shortness of breath.  She denies any calf pain or leg swelling.  No personal or family history of PE or DVT or heart disease.   Patient Active Problem List   Diagnosis Date Noted   Uterine mass 01/04/2019   Endomyometritis 10/22/2015   Postpartum care following vaginal delivery 10/08/2015   Back pain affecting pregnancy in second trimester 10/08/2015   Iron deficiency anemia 08/28/2015   Difficulty in swallowing 05/22/2015   History of abnormal cervical Pap smear 04/24/2015          Physical Exam   Triage Vital Signs: ED Triage Vitals  Enc Vitals Group     BP 10/24/21 1607 124/86     Pulse Rate 10/24/21 1607 (!) 108     Resp 10/24/21 1607 16     Temp 10/24/21 1607 98.6 F (37 C)     Temp Source 10/24/21 1607 Oral     SpO2 10/24/21 1607 98 %     Weight 10/24/21 1608 125 lb (56.7 kg)     Height 10/24/21 1608 '5\' 7"'$  (1.702 m)     Head Circumference --      Peak Flow --      Pain Score 10/24/21 1608 5     Pain Loc --      Pain Edu? --      Excl. in [place]? --     Most recent vital signs: Vitals:   10/24/21 1607 10/24/21 1940  BP: 124/86 102/70  Pulse: (!) 108 83  Resp: 16 10  Temp: 98.6 F (37 C)   SpO2: 98% 99%    Physical Exam Vitals and nursing note reviewed.  Constitutional:      General: Awake and alert. No acute distress.    Appearance: Normal appearance. The patient is normal weight.  HENT:     Head: Normocephalic and atraumatic.     Mouth: Mucous membranes are moist.  Eyes:     General: PERRL. Normal EOMs        Right eye: No discharge.         Left eye: No discharge.     Conjunctiva/sclera: Conjunctivae normal.  Cardiovascular:     Rate and Rhythm: Normal rate and regular rhythm.     Pulses: Normal pulses.     Heart sounds: Normal heart sounds Pulmonary:     Effort: Pulmonary effort is normal. No respiratory distress.     Breath sounds: Normal breath sounds.  Abdominal:     Abdomen is soft. There is no abdominal tenderness. No rebound or guarding. No distention. Musculoskeletal:        General: No swelling. Normal range of motion.     Cervical back: Normal range of motion and neck supple.  No lower extremity edema or tenderness Skin:    General: Skin is warm and dry.     Capillary Refill: Capillary refill takes less than 2 seconds.     Findings: No rash.  Neurological:     Mental Status: The patient is awake and alert.      ED  Results / Procedures / Treatments   Labs (all labs ordered are listed, but only abnormal results are displayed) Labs Reviewed  BASIC METABOLIC PANEL - Abnormal; Notable for the following components:      Result Value   Potassium 3.2 (*)    Glucose, Bld 109 (*)    All other components within normal limits  CBC  D-DIMER, QUANTITATIVE  TSH  POC URINE PREG, ED  TROPONIN I (HIGH SENSITIVITY)  TROPONIN I (HIGH SENSITIVITY)     EKG     RADIOLOGY I independently reviewed and interpreted imaging and agree with radiologists findings.     PROCEDURES:  Critical Care performed:   Procedures   MEDICATIONS ORDERED IN ED: Medications - No data to display   IMPRESSION / MDM / [place] / ED COURSE  I reviewed the triage vital signs and the nursing notes.   Differential diagnosis includes, but is not limited to, ACS, PE, costochondritis, pneumothorax.  Patient is awake and alert, nontoxic in appearance.  EKG is nonischemic.  She was found to have an elevated heart rate to 108 on arrival, therefore could not PERC her out.  She agreed to labs including troponin and D-dimer.   Troponin is negative x2, as well as D-dimer.  She has no chest pain with deep inspiration, no clinical signs or symptoms of DVT.  EKG is nonischemic. HEART Score 0 for MACE in 6 weeks.  Repeat heart rate has normalized without intervention, no hypoxia.  Overall well-appearing. No ST/PR changes to suggest pericarditis. No pneumothorax, normal mediastinal width, no radiation to back.  Upon reevaluation, patient reports that her symptoms have improved significantly.  She attributes her symptoms to anxiety. Denies SI/HI. discussed care plan, return precautions, and advised close outpatient follow-up. Patient agrees with plan of care.   Patient's presentation is most consistent with acute complicated illness / injury requiring diagnostic workup.   Clinical Course as of 10/24/21 2022  Sat Oct 24, 2021  1850 Patient re-evaluated, reports her symptoms have resolved [JP]    Clinical Course User Index [JP] Nuriyah Hanline, Clarnce Flock, PA-C     FINAL CLINICAL IMPRESSION(S) / ED DIAGNOSES   Final diagnoses:  Atypical chest pain     Rx / DC Orders   ED Discharge Orders     None        Note:  This document was prepared using Dragon voice recognition software and may include unintentional dictation errors.   Emeline Gins 10/24/21 2022    Nena Polio, MD 10/24/21 2128

## 2021-10-24 NOTE — ED Triage Notes (Signed)
Pt via POV from home. Pt c/o L sided chest pain and tingling. States that it started last night, some mild SOB. Denies any other complaints states it similar to what she felt when she had a panic attack but states she did not have one last night. Pt is A&Ox4 and NAD

## 2021-10-24 NOTE — Discharge Instructions (Signed)
Your EKG, chest x-ray, and blood tests are reassuring. You may continue to take tylenol/motrin per package instructions as needed for pain. Return for any new, worsening, or change in symptoms or other concerns.

## 2021-12-25 ENCOUNTER — Emergency Department
Admission: EM | Admit: 2021-12-25 | Discharge: 2021-12-25 | Disposition: A | Discharge status: Home / Self Care | Payer: Medicaid Other | Attending: Emergency Medicine | Admitting: Emergency Medicine

## 2021-12-25 ENCOUNTER — Encounter: Payer: Self-pay | Admitting: Emergency Medicine

## 2021-12-25 DIAGNOSIS — H60501 Unspecified acute noninfective otitis externa, right ear: Secondary | ICD-10-CM | POA: Insufficient documentation

## 2021-12-25 MED ORDER — OFLOXACIN 0.3 % OT SOLN: 5.0000 [drp] | Freq: Two times a day (BID) | OTIC | 0 refills | Status: AC

## 2021-12-25 NOTE — ED Triage Notes (Signed)
Pt to ED via POV for right ear pain

## 2021-12-25 NOTE — ED Provider Notes (Signed)
[facility] Provider Note    Event Date/Time   First MD Initiated Contact with Patient 12/25/21 1554     (approximate)   History   Ear Pain   HPI  Lindsey Cain is a 36 y.o. female with no reported past medical history presents today for evaluation of right ear irritation.  She reports that she thought that a fly flew into her ear, and then she was scratching in her ear and now she has pain.  The buzzing has resolved.  She has not had any discharge or bleeding from ear.  No changes in her hearing.         Physical Exam   Triage Vital Signs: ED Triage Vitals  Enc Vitals Group     BP      Pulse      Resp      Temp      Temp src      SpO2      Weight      Height      Head Circumference      Peak Flow      Pain Score      Pain Loc      Pain Edu?      Excl. in [place]?     Most recent vital signs: Vitals:   12/25/21 1605  BP: 129/82  Pulse: (!) 106  Resp: 19  Temp: 98.5 F (36.9 C)  SpO2: 100%    Physical Exam Vitals and nursing note reviewed.  Constitutional:      General: Awake and alert. No acute distress.    Appearance: Normal appearance. The patient is normal weight.  HENT:     Head: Normocephalic and atraumatic.     Mouth: Mucous membranes are moist.  Left TM clear, normal canal, normal pinna.  No mastoid tenderness or erythema Right TM normal, no effusion.  No foreign body noted in the canal.  Canal appears to be irritated with small amount of dried blood and abrasion along the canal.  No mastoid tenderness or erythema.  No proptosis of pinna.  Discomfort with manipulation of pinna Eyes:     General: PERRL. Normal EOMs        Right eye: No discharge.        Left eye: No discharge.     Conjunctiva/sclera: Conjunctivae normal.  Cardiovascular:     Rate and Rhythm: Normal rate and regular rhythm.     Pulses: Normal pulses.  Pulmonary:     Effort: Pulmonary effort is normal. No respiratory distress.  Abdominal:      Abdomen is soft. There is no abdominal tenderness. Musculoskeletal:        General: No swelling. Normal range of motion.     Cervical back: Normal range of motion and neck supple.  Skin:    General: Skin is warm and dry.     Capillary Refill: Capillary refill takes less than 2 seconds.     Findings: No rash.  Neurological:     Mental Status: The patient is awake and alert.      ED Results / Procedures / Treatments   Labs (all labs ordered are listed, but only abnormal results are displayed) Labs Reviewed - No data to display   EKG     RADIOLOGY     PROCEDURES:  Critical Care performed:   Procedures   MEDICATIONS ORDERED IN ED: Medications - No data to display   IMPRESSION / MDM / ASSESSMENT  AND PLAN / ED COURSE  I reviewed the triage vital signs and the nursing notes.   Differential diagnosis includes, but is not limited to, retained foreign body, otitis externa, otitis media, cerumen impaction.  Patient is awake and alert, hemodynamically stable and afebrile.  There is no foreign body noted on exam, however she does have a canal abrasion and pain with palpation of pinna.  We will treat as otitis externa.  No mastoid tenderness or erythema, not consistent with mastoiditis.  No changes in her hearing.  We discussed return precautions and outpatient follow-up.  Patient understands and agrees with plan.  She was discharged in stable condition.  Patient's presentation is most consistent with acute illness / injury with system symptoms.       FINAL CLINICAL IMPRESSION(S) / ED DIAGNOSES   Final diagnoses:  Acute otitis externa of right ear, unspecified type     Rx / DC Orders   ED Discharge Orders          Ordered    ofloxacin (FLOXIN) 0.3 % OTIC solution  2 times daily        12/25/21 1559             Note:  This document was prepared using Dragon voice recognition software and may include unintentional dictation errors.   Marquette Old,  PA-C 12/25/21 1714    Lavonia Drafts, MD 12/25/21 [phone]

## 2021-12-25 NOTE — Discharge Instructions (Addendum)
Take the antibiotics as prescribed. Return for any new, worsening, or change in symptoms or other concerns. It was a pleasure caring for you today.

## 2021-12-27 ENCOUNTER — Encounter: Payer: Self-pay | Admitting: Oncology

## 2021-12-28 ENCOUNTER — Encounter: Payer: Self-pay | Admitting: Oncology

## 2022-01-08 ENCOUNTER — Emergency Department
Admission: EM | Admit: 2022-01-08 | Discharge: 2022-01-09 | Disposition: A | Discharge status: Home / Self Care | Payer: Medicaid Other | Attending: Emergency Medicine | Admitting: Emergency Medicine

## 2022-01-08 ENCOUNTER — Other Ambulatory Visit: Payer: Self-pay

## 2022-01-08 ENCOUNTER — Emergency Department: Payer: Medicaid Other

## 2022-01-08 ENCOUNTER — Encounter: Payer: Self-pay | Admitting: Emergency Medicine

## 2022-01-08 DIAGNOSIS — J45909 Unspecified asthma, uncomplicated: Secondary | ICD-10-CM | POA: Insufficient documentation

## 2022-01-08 DIAGNOSIS — G44001 Cluster headache syndrome, unspecified, intractable: Secondary | ICD-10-CM | POA: Insufficient documentation

## 2022-01-08 DIAGNOSIS — G932 Benign intracranial hypertension: Secondary | ICD-10-CM | POA: Insufficient documentation

## 2022-01-08 LAB — BASIC METABOLIC PANEL
Anion gap: 6 (ref 5–15)
BUN: 9 mg/dL (ref 6–20)
CO2: 26 mmol/L (ref 22–32)
Calcium: 9.4 mg/dL (ref 8.9–10.3)
Chloride: 105 mmol/L (ref 98–111)
Creatinine, Ser: 0.65 mg/dL (ref 0.44–1.00)
GFR, Estimated: >60 mL/min (ref >60)
Glucose, Bld: 96 mg/dL (ref 70–99)
Potassium: 3.4 mmol/L — ABNORMAL LOW (ref 3.5–5.1)
Sodium: 137 mmol/L (ref 135–145)

## 2022-01-08 LAB — CBC WITH DIFFERENTIAL/PLATELET
Abs Immature Granulocytes: 0.01 10*3/uL (ref 0.00–0.07)
Basophils Absolute: 0 10*3/uL (ref 0.0–0.1)
Basophils Relative: 0 %
Eosinophils Absolute: 0 10*3/uL (ref 0.0–0.5)
Eosinophils Relative: 1 %
HCT: 36.3 % (ref 36.0–46.0)
Hemoglobin: 11.9 g/dL — ABNORMAL LOW (ref 12.0–15.0)
Immature Granulocytes: 0 %
Lymphocytes Relative: 48 %
Lymphs Abs: 2.3 10*3/uL (ref 0.7–4.0)
MCH: 28.3 pg (ref 26.0–34.0)
MCHC: 32.8 g/dL (ref 30.0–36.0)
MCV: 86.2 fL (ref 80.0–100.0)
Monocytes Absolute: 0.3 10*3/uL (ref 0.1–1.0)
Monocytes Relative: 6 %
Neutro Abs: 2.1 10*3/uL (ref 1.7–7.7)
Neutrophils Relative %: 45 %
Platelets: 188 10*3/uL (ref 150–400)
RBC: 4.21 MIL/uL (ref 3.87–5.11)
RDW: 12.6 % (ref 11.5–15.5)
WBC: 4.7 10*3/uL (ref 4.0–10.5)
nRBC: 0 % (ref 0.0–0.2)

## 2022-01-08 LAB — PROTIME-INR
INR: 1.2 (ref 0.8–1.2)
Prothrombin Time: 14.6 seconds (ref 11.4–15.2)

## 2022-01-08 LAB — POC URINE PREG, ED: Preg Test, Ur: NEGATIVE

## 2022-01-08 MED ORDER — ONDANSETRON 4 MG PO TBDP: 4.0000 mg | ORAL_TABLET | Freq: Three times a day (TID) | ORAL | 0 refills | Status: DC | PRN

## 2022-01-08 MED ORDER — KETOROLAC TROMETHAMINE 30 MG/ML IJ SOLN: 30.0000 mg | Freq: Once | INTRAMUSCULAR | Status: DC

## 2022-01-08 MED ORDER — ACETAZOLAMIDE 125 MG PO TABS: 500.0000 mg | ORAL_TABLET | Freq: Two times a day (BID) | ORAL | 2 refills | Status: DC

## 2022-01-08 MED ORDER — LORAZEPAM 2 MG/ML IJ SOLN
0.5000 mg | INTRAMUSCULAR | Status: DC
  Filled 2022-01-08: qty 1

## 2022-01-08 MED ORDER — METOCLOPRAMIDE HCL 10 MG PO TABS
10.0000 mg | ORAL_TABLET | Freq: Once | ORAL | Status: AC
  Administered 2022-01-08: 10 mg via ORAL
  Filled 2022-01-08: qty 1

## 2022-01-08 MED ORDER — KETOROLAC TROMETHAMINE 30 MG/ML IJ SOLN
30.0000 mg | Freq: Once | INTRAMUSCULAR | Status: DC
  Filled 2022-01-08: qty 1

## 2022-01-08 MED ORDER — GADOBUTROL 1 MMOL/ML IV SOLN
5.0000 mL | Freq: Once | INTRAVENOUS | Status: AC | PRN
  Administered 2022-01-08: 5 mL via INTRAVENOUS

## 2022-01-08 NOTE — ED Provider Notes (Signed)
[facility] Emergency Department Provider Note     Event Date/Time   First MD Initiated Contact with Patient 01/08/22 1524     (approximate)   History   Headache   HPI  Lindsey Cain is a 36 y.o. female with a history of iron anemia,and asthma, who presents with a 1-week complaint of persistent headaches. She would describes a "squeezing" pressure to the temples and across the top of the head. She denies light sensitivity, nausea, vomiting, or weakness. She notes today, symptoms of dizziness, blurry vision today. No relief with OTC Aleve. She denies recent illness, dental work, head injury, or history of recurrent headaches.  She does recall however, she reported to the ED approximately 2 weeks prior to the onset of her headache syndrome, with concern for possible bug in her ear.  She noted that she had some buzzing and ringing in her ear and reported here for evaluation.  She was told that there was no evidence of any bug or retained foreign body in her ear.  She presents herself to the ED today for further evaluation of persistent headache.     Physical Exam   Triage Vital Signs: ED Triage Vitals  Enc Vitals Group     BP 01/08/22 1316 132/76     Pulse Rate 01/08/22 1316 82     Resp 01/08/22 1316 16     Temp 01/08/22 1316 98.2 F (36.8 C)     Temp Source 01/08/22 1316 Oral     SpO2 01/08/22 1316 100 %     Weight 01/08/22 1317 125 lb (56.7 kg)     Height 01/08/22 1317 '5\' 6"'$  (1.676 m)     Head Circumference --      Peak Flow --      Pain Score 01/08/22 1321 10     Pain Loc --      Pain Edu? --      Excl. in [place]? --     Most recent vital signs: Vitals:   01/08/22 1316 01/08/22 2304  BP: 132/76 130/78  Pulse: 82 80  Resp: 16 17  Temp: 98.2 F (36.8 C) 98 F (36.7 C)  SpO2: 100% 100%    General Awake, no distress. NAD HEENT NCAT. PERRL. EOMI. Normal fundi bilaterally. TMs clear bilaterally. No rhinorrhea. Mucous membranes are moist.   CV:  Good peripheral perfusion.  RESP:  Normal effort.  ABD:  No distention.  NEURO: Cranial nerves II to XII grossly intact.   ED Results / Procedures / Treatments   Labs (all labs ordered are listed, but only abnormal results are displayed) Labs Reviewed  BASIC METABOLIC PANEL - Abnormal; Notable for the following components:      Result Value   Potassium 3.4 (*)    All other components within normal limits  CBC WITH DIFFERENTIAL/PLATELET - Abnormal; Notable for the following components:   Hemoglobin 11.9 (*)    All other components within normal limits  PROTIME-INR  POC URINE PREG, ED     EKG   RADIOLOGY  I personally viewed and evaluated these images as part of my medical decision making, as well as reviewing the written report by the radiologist.  ED Provider Interpretation: no acute findings  MR BRAIN WO CONTRAST  Result Date: 01/08/2022 CLINICAL DATA:  Initial evaluation for headache, neuro deficit. EXAM: MRI HEAD WITHOUT CONTRAST MRV HEAD WITHOUT AND WITH CONTRAST TECHNIQUE: Multiplanar, multiecho pulse sequences of the brain and surrounding structures were obtained  without and with intravenous contrast. Angiographic images of the intracranial venous structures were obtained using MRV technique without intravenous contrast. CONTRAST:  42m GADAVIST GADOBUTROL 1 MMOL/ML IV SOLN COMPARISON:  None Available. FINDINGS: MRI HEAD FINDINGS Brain: Cerebral volume within normal limits for age. No focal parenchymal signal abnormality. No abnormal foci of restricted diffusion to suggest acute or subacute ischemia. Gray-white matter differentiation well maintained. No encephalomalacia to suggest chronic cortical infarction or other insult. No foci of susceptibility artifact indicative of acute or chronic intracranial blood products. No mass lesion, midline shift or mass effect. Ventricles normal in size and morphology without hydrocephalus. No extra-axial fluid collection. Pituitary  gland and suprasellar region within normal limits. No imaging findings of idiopathic intracranial hypertension. Vascular: Major intracranial vascular flow voids are well maintained. Asymmetric FLAIR signal intensity noted about the hypoplastic left transverse sinus. This appears patent on corresponding MRV. Skull and upper cervical spine: Craniocervical junction within normal limits. Visualized upper cervical spine demonstrates no significant finding. Bone marrow signal intensity within normal limits. No scalp soft tissue abnormality. Sinuses/Orbits: Globes and orbital soft tissues are within normal limits. Mild mucosal thickening about the ethmoidal air cells and maxillary sinuses. Paranasal sinuses are otherwise clear. No mastoid effusion. Other: None. MRV HEAD FINDINGS Normal flow related signal and enhancement seen throughout the superior sagittal sinus to the torcula. Transverse and sigmoid sinuses are patent as are the jugular bulbs and visualized proximal internal jugular veins. Right transverse sinus dominant, with a diffusely hypoplastic left transverse sinus. Straight sinus, vein of Galen, internal cerebral veins, and basal veins of Rosenthal are patent. No appreciable cortical vein thrombosis. No dural sinus thrombosis or stenosis. IMPRESSION: 1. Normal brain MRI. No acute intracranial abnormality. 2. Normal intracranial MRV. No evidence for dural sinus thrombosis. Electronically Signed   By: BJeannine BogaM.D.   On: 01/08/2022 22:57   MR MRV HEAD W WO CONTRAST  Result Date: 01/08/2022 CLINICAL DATA:  Initial evaluation for headache, neuro deficit. EXAM: MRI HEAD WITHOUT CONTRAST MRV HEAD WITHOUT AND WITH CONTRAST TECHNIQUE: Multiplanar, multiecho pulse sequences of the brain and surrounding structures were obtained without and with intravenous contrast. Angiographic images of the intracranial venous structures were obtained using MRV technique without intravenous contrast. CONTRAST:  534m GADAVIST GADOBUTROL 1 MMOL/ML IV SOLN COMPARISON:  None Available. FINDINGS: MRI HEAD FINDINGS Brain: Cerebral volume within normal limits for age. No focal parenchymal signal abnormality. No abnormal foci of restricted diffusion to suggest acute or subacute ischemia. Gray-white matter differentiation well maintained. No encephalomalacia to suggest chronic cortical infarction or other insult. No foci of susceptibility artifact indicative of acute or chronic intracranial blood products. No mass lesion, midline shift or mass effect. Ventricles normal in size and morphology without hydrocephalus. No extra-axial fluid collection. Pituitary gland and suprasellar region within normal limits. No imaging findings of idiopathic intracranial hypertension. Vascular: Major intracranial vascular flow voids are well maintained. Asymmetric FLAIR signal intensity noted about the hypoplastic left transverse sinus. This appears patent on corresponding MRV. Skull and upper cervical spine: Craniocervical junction within normal limits. Visualized upper cervical spine demonstrates no significant finding. Bone marrow signal intensity within normal limits. No scalp soft tissue abnormality. Sinuses/Orbits: Globes and orbital soft tissues are within normal limits. Mild mucosal thickening about the ethmoidal air cells and maxillary sinuses. Paranasal sinuses are otherwise clear. No mastoid effusion. Other: None. MRV HEAD FINDINGS Normal flow related signal and enhancement seen throughout the superior sagittal sinus to the torcula. Transverse and sigmoid sinuses are patent  as are the jugular bulbs and visualized proximal internal jugular veins. Right transverse sinus dominant, with a diffusely hypoplastic left transverse sinus. Straight sinus, vein of Galen, internal cerebral veins, and basal veins of Rosenthal are patent. No appreciable cortical vein thrombosis. No dural sinus thrombosis or stenosis. IMPRESSION: 1. Normal brain MRI. No acute  intracranial abnormality. 2. Normal intracranial MRV. No evidence for dural sinus thrombosis. Electronically Signed   By: Jeannine Boga M.D.   On: 01/08/2022 22:57   CT HEAD WO CONTRAST (5MM)  Result Date: 01/08/2022 CLINICAL DATA:  Sudden, severe headache for the past 2 days. EXAM: CT HEAD WITHOUT CONTRAST TECHNIQUE: Contiguous axial images were obtained from the base of the skull through the vertex without intravenous contrast. RADIATION DOSE REDUCTION: This exam was performed according to the departmental dose-optimization program which includes automated exposure control, adjustment of the mA and/or kV according to patient size and/or use of iterative reconstruction technique. COMPARISON:  None Available. FINDINGS: Brain: The ventricles and cortical sulci are small. No intracranial hemorrhage, mass lesion or CT evidence of acute infarction. There is also some flattening of the posterior globes. No findings suspicious for dural sinus thrombosis. Vascular: No hyperdense vessel or unexpected calcification. Skull: Normal. Negative for fracture or focal lesion. Sinuses/Orbits: Mild flattening of the posterior globes. Unremarkable bones and included paranasal sinuses. Other: None. IMPRESSION: 1. No acute abnormality. 2. Small ventricles and cortical sulci with some flattening of the posterior globes. This can be seen with pseudotumor cerebri. Electronically Signed   By: Claudie Revering M.D.   On: 01/08/2022 16:46     PROCEDURES:  Critical Care performed: No  Procedures   MEDICATIONS ORDERED IN ED: Medications  LORazepam (ATIVAN) injection 0.5 mg (has no administration in time range)  metoCLOPramide (REGLAN) tablet 10 mg (10 mg Oral Given 01/08/22 1918)  gadobutrol (GADAVIST) 1 MMOL/ML injection 5 mL (5 mLs Intravenous Contrast Given 01/08/22 2112)     IMPRESSION / MDM / ASSESSMENT AND PLAN / ED COURSE  I reviewed the triage vital signs and the nursing notes.                               Differential diagnosis includes, but is not limited to, intracranial hemorrhage, meningitis/encephalitis, previous head trauma, cavernous venous thrombosis, tension headache, temporal arteritis, migraine or migraine equivalent, idiopathic intracranial hypertension, and non-specific headache.   Patient's presentation is most consistent with acute presentation with potential threat to life or bodily function.  ----------------------------------------- 7:43 PM on 01/08/2022 ----------------------------------------- S/W Dr.M. Leonel Ramsay (Neuro): He recommends further eval ration of the MRI/MRV, and an LP to determine opening pressures.  He would recommend starting acetazolamide at 500 mg twice daily since the patient has symptoms consistent with IIH.   ----------------------------------------- 11:28 PM on 01/08/2022 ----------------------------------------- Interim update to the patient and her mother was at bedside.  Regarding the normal MRI MRA results.  The plan as discussed earlier was to proceed with an LP for further diagnosis of suspected IIH.  I discussed the risk and benefits related to the LP procedure.  I discussed the diagnostic criteria to this point leading to the need for this procedure.  I also reiterated the fact that I discussed her case with the on-call neurologist.  Patient and mother were allowed time to process, asked questions, and I answered the questions to the best of my ability.  Patient decided after reconsideration, that he did not want to proceed with an  LP at this time.  She is willing however to start medication as prescribed, follow-up with neurology and ophthalmology as discussed.  I again patient had a clear understanding of the diagnoses and there are results to this point.  She did verbalize understanding that she has a headache condition that seems likely related to pseudotumor cerebri.  She further understands verbalizes that she will need to be followed closely  by neurology and ophthalmology to prevent progression of vision loss.  Patient was able to expect to meet her diagnoses and have a full understanding of what her resulted labs and imaging were able to verify.  The patient and her mother thanked me for time, care, consideration given during her time in the ED.  She is stable at this time reporting some improvement of her headache pain overall.  She will follow-up with neurology and ophthalmology as discussed.  Patient will be given copies of her CT, MRI, and lab results at this time.  Patient's diagnosis is consistent with probable idiopathic intracranial hypertension. Patient will be discharged home with prescriptions for acetazolamide and Zofran. Patient is to follow up with neurology and ophthalmology as referred as needed or otherwise directed. Patient is given ED precautions to return to the ED for any worsening or new symptoms.  Clinical Course as of 01/08/22 2355  [facility] Jan 08, 2022  5093 Basic metabolic panel(!) [JM]    Clinical Course User Index [JM] Carmie End Dannielle Karvonen, PA-C    FINAL CLINICAL IMPRESSION(S) / ED DIAGNOSES   Final diagnoses:  Intractable cluster headache syndrome, unspecified chronicity pattern  Idiopathic intracranial hypertension     Rx / DC Orders   ED Discharge Orders          Ordered    acetaZOLAMIDE (DIAMOX) 125 MG tablet  2 times daily        01/08/22 2344    ondansetron (ZOFRAN-ODT) 4 MG disintegrating tablet  Every 8 hours PRN        01/08/22 2344             Note:  This document was prepared using Dragon voice recognition software and may include unintentional dictation errors.    Melvenia Needles, PA-C 01/08/22 2355    Lucillie Garfinkel, MD 01/13/22 2122

## 2022-01-08 NOTE — ED Triage Notes (Signed)
Patient to ED via POV for headaches on and off for the past week. Pain is described as a sharp pain to the temples, 10/10. Denies light sensitivity.

## 2022-01-08 NOTE — ED Triage Notes (Signed)
Says really bad  headache for couple days.  Seen a [facility] last week.  Was given meds--but she could not pay for it.

## 2022-01-08 NOTE — Discharge Instructions (Addendum)
Your exam, labs, CT scan, and MRI overall reassuring.  Your CT scan findings were concerning for possible diagnosis of idiopathic intracranial hypertension (AKA pseudotumor cerebri).  This diagnosis can cause the multitude of symptoms you have experienced including headache, dizziness, ringing in the ears, and visual disturbance.  The diagnoses requires a lumbar puncture (LP) (AKA spinal tap) to confirm it.  You have been offered LP in the ED to confirm the diagnosis.  You have declined this test at this time.  You will be started on an appropriate medication for idiopathic intracranial hypertension.  You should follow-up with both neurology and ophthalmology for further testing and management.  Return to the ED for any concerning symptoms.

## 2022-01-18 ENCOUNTER — Emergency Department
Admission: EM | Admit: 2022-01-18 | Discharge: 2022-01-18 | Discharge status: Against Medical Advice | Payer: Medicaid Other

## 2022-01-18 NOTE — ED Notes (Signed)
No answer when called several times from lobby 

## 2022-03-13 ENCOUNTER — Encounter: Payer: Self-pay | Admitting: Oncology

## 2022-03-13 ENCOUNTER — Emergency Department
Admission: EM | Admit: 2022-03-13 | Discharge: 2022-03-13 | Disposition: A | Discharge status: Home / Self Care | Payer: Medicaid Other | Attending: Emergency Medicine | Admitting: Emergency Medicine

## 2022-03-13 ENCOUNTER — Encounter: Payer: Self-pay | Admitting: Emergency Medicine

## 2022-03-13 ENCOUNTER — Other Ambulatory Visit: Payer: Self-pay

## 2022-03-13 DIAGNOSIS — R42 Dizziness and giddiness: Secondary | ICD-10-CM | POA: Insufficient documentation

## 2022-03-13 DIAGNOSIS — R110 Nausea: Secondary | ICD-10-CM | POA: Insufficient documentation

## 2022-03-13 DIAGNOSIS — R519 Headache, unspecified: Secondary | ICD-10-CM | POA: Insufficient documentation

## 2022-03-13 LAB — CBC
HCT: 38.4 % (ref 36.0–46.0)
Hemoglobin: 12.5 g/dL (ref 12.0–15.0)
MCH: 28.3 pg (ref 26.0–34.0)
MCHC: 32.6 g/dL (ref 30.0–36.0)
MCV: 87.1 fL (ref 80.0–100.0)
Platelets: 195 10*3/uL (ref 150–400)
RBC: 4.41 MIL/uL (ref 3.87–5.11)
RDW: 12.2 % (ref 11.5–15.5)
WBC: 3.1 10*3/uL — ABNORMAL LOW (ref 4.0–10.5)
nRBC: 0 % (ref 0.0–0.2)

## 2022-03-13 LAB — URINALYSIS, ROUTINE W REFLEX MICROSCOPIC
Bacteria, UA: NONE SEEN
Bilirubin Urine: NEGATIVE
Glucose, UA: NEGATIVE mg/dL
Hgb urine dipstick: NEGATIVE
Ketones, ur: NEGATIVE mg/dL
Leukocytes,Ua: NEGATIVE
Nitrite: NEGATIVE
Protein, ur: 30 mg/dL — AB
Specific Gravity, Urine: 1.032 — ABNORMAL HIGH (ref 1.005–1.030)
pH: 5 (ref 5.0–8.0)

## 2022-03-13 LAB — BASIC METABOLIC PANEL
Anion gap: 11 (ref 5–15)
BUN: 11 mg/dL (ref 6–20)
CO2: 24 mmol/L (ref 22–32)
Calcium: 9.1 mg/dL (ref 8.9–10.3)
Chloride: 99 mmol/L (ref 98–111)
Creatinine, Ser: 0.84 mg/dL (ref 0.44–1.00)
GFR, Estimated: >60 mL/min (ref >60)
Glucose, Bld: 118 mg/dL — ABNORMAL HIGH (ref 70–99)
Potassium: 3.1 mmol/L — ABNORMAL LOW (ref 3.5–5.1)
Sodium: 134 mmol/L — ABNORMAL LOW (ref 135–145)

## 2022-03-13 LAB — PREGNANCY, URINE: Preg Test, Ur: NEGATIVE

## 2022-03-13 MED ORDER — ACETAMINOPHEN 325 MG PO TABS
650.0000 mg | ORAL_TABLET | Freq: Once | ORAL | Status: DC
  Filled 2022-03-13: qty 2

## 2022-03-13 MED ORDER — METOCLOPRAMIDE HCL 10 MG PO TABS
10.0000 mg | ORAL_TABLET | Freq: Once | ORAL | Status: AC
  Administered 2022-03-13: 10 mg via ORAL
  Filled 2022-03-13: qty 1

## 2022-03-13 MED ORDER — METOCLOPRAMIDE HCL 10 MG PO TABS: 10.0000 mg | ORAL_TABLET | Freq: Three times a day (TID) | ORAL | 0 refills | Status: DC

## 2022-03-13 NOTE — ED Triage Notes (Signed)
Pt to ER with c/o dizziness and nausea that started approximately 90 mins ago.  Pt states she was driving when it happened.  Pt states she went home and tried to lay down and that she drank water in case she was dehydrated.  Pt denies vomiting.  Pt denies CP, SHOB, states she does have a mild headache.

## 2022-03-13 NOTE — Discharge Instructions (Addendum)
Your exam and labs overall reassuring at this time.  No signs of dehydration, infection, or anemia.  Continue to monitor and treat symptoms as necessary.  Avoid going prolonged periods without eating or drinking.  Take OTC Tylenol or Motrin along with the nausea medicine for ongoing symptoms.  Follow-up with your primary provider, your neurologist, or return to the ED if necessary. Please go to the following website to schedule new (and existing) patient appointments:   [email or web address]   The following is a list of primary care offices in the area who are accepting new patients at this time.  Please reach out to one of them directly and let them know you would like to schedule an appointment to follow up on an Emergency Department visit, and/or to establish a new primary care provider (PCP).  There are likely other primary care clinics in the are who are accepting new patients, but this is an excellent place to start:  [facility] physician: Dr Lavon Paganini [address] [place], [place] [address] [phone]  [facility] Lead Physician: Dr Steele Sizer [address], [place], [place] [address] [phone]  [place] Physician: Dr Park Liter [address] [place], [place] [address] [phone]  [facility] Lead Physician: Dr Dewaine Oats [address] [place], [place] [address] [phone]  [facility] Physician: Dr Halina Maidens [address] [place], [place],  [address] [phone]

## 2022-03-13 NOTE — ED Provider Notes (Signed)
[facility] Emergency Department Provider Note     Event Date/Time   First MD Initiated Contact with Patient 03/13/22 1901     (approximate)   History   Dizziness and Nausea   HPI  Lindsey Cain is a 37 y.o. female with a history of anemia, endometritis, and chronic non-intractable headache, presents to the ED with complaints of dizziness with nausea.  Patient reports onset of symptoms approximately 90 minutes prior to arrival.  She was driving when symptoms began, was able to get herself home without difficulty patient denies any associated vision change, weakness, vertigo, tinnitus, or hearing loss.  Patient presents to the ED for persistent symptoms.  She denies any frank chest pain, shortness of breath, does endorse a mild headache, and improving dizziness at this time.  She admits that she has not had any solid food since breakfast this morning.  Physical Exam   Triage Vital Signs: ED Triage Vitals [03/13/22 1837]  Enc Vitals Group     BP 121/88     Pulse Rate 92     Resp 18     Temp (!) 97.5 F (36.4 C)     Temp src      SpO2 100 %     Weight 115 lb (52.2 kg)     Height '5\' 6"'$  (1.676 m)     Head Circumference      Peak Flow      Pain Score 0     Pain Loc      Pain Edu?      Excl. in [place]?     Most recent vital signs: Vitals:   03/13/22 1837 03/13/22 2053  BP: 121/88 121/85  Pulse: 92 90  Resp: 18 18  Temp: (!) 97.5 F (36.4 C) 97.9 F (36.6 C)  SpO2: 100% 100%    General Awake, no distress. NAD HEENT NCAT. PERRL. EOMI. TMs intact bilaterally.  No effusion appreciated.  No rhinorrhea. Mucous membranes are moist.  CV:  Good peripheral perfusion.  RESP:  Normal effort.  ABD:  No distention.  NEURO: Cranial nerves II to XII grossly intact.   ED Results / Procedures / Treatments   Labs (all labs ordered are listed, but only abnormal results are displayed) Labs Reviewed  BASIC METABOLIC PANEL - Abnormal; Notable for the  following components:      Result Value   Sodium 134 (*)    Potassium 3.1 (*)    Glucose, Bld 118 (*)    All other components within normal limits  CBC - Abnormal; Notable for the following components:   WBC 3.1 (*)    All other components within normal limits  URINALYSIS, ROUTINE W REFLEX MICROSCOPIC - Abnormal; Notable for the following components:   Color, Urine YELLOW (*)    APPearance HAZY (*)    Specific Gravity, Urine 1.032 (*)    Protein, ur 30 (*)    All other components within normal limits  PREGNANCY, URINE  CBG MONITORING, ED  POC URINE PREG, ED   EKG  Vent. rate 92 BPM PR interval 132 ms QRS duration 72 ms QT/QTcB 360/445 ms P-R-T axes 80 51 58  RADIOLOGY  No results found.   PROCEDURES:  Critical Care performed: No  Procedures   MEDICATIONS ORDERED IN ED: Medications  acetaminophen (TYLENOL) tablet 650 mg (650 mg Oral Not Given 03/13/22 1953)  metoCLOPramide (REGLAN) tablet 10 mg (10 mg Oral Given 03/13/22 1952)     IMPRESSION /  MDM / ASSESSMENT AND PLAN / ED COURSE  I reviewed the triage vital signs and the nursing notes.                              Differential diagnosis includes, but is not limited to, headache, AOM, sinusitis, dehydration, electrolyte abnormality, EKG changes, pregnancy  Patient's presentation is most consistent with acute complicated illness / injury requiring diagnostic workup.  Patient to the ED for evaluation of episodic dizziness just prior to arrival.  Patient presents to the ED in no acute distress.  She endorses improved symptoms at this time.  She denies any tinnitus, vertigo, weakness, or intractable headache.  She also denies any vomiting.  Patient's diagnosis is consistent with dizziness doubt acute neurodeficit.. Patient will be discharged home with prescriptions for Reglan. Patient is to follow up with her neurologist as needed or otherwise directed. Patient is given ED precautions to return to the ED for any  worsening or new symptoms.     FINAL CLINICAL IMPRESSION(S) / ED DIAGNOSES   Final diagnoses:  Dizziness     Rx / DC Orders   ED Discharge Orders          Ordered    metoCLOPramide (REGLAN) 10 MG tablet  3 times daily with meals        03/13/22 2040             Note:  This document was prepared using Dragon voice recognition software and may include unintentional dictation errors.    Melvenia Needles, PA-C 03/13/22 2339    Duffy Bruce, MD 03/14/22 0001

## 2022-09-09 ENCOUNTER — Emergency Department: Payer: Medicaid Other

## 2022-09-09 ENCOUNTER — Other Ambulatory Visit: Payer: Self-pay

## 2022-09-09 ENCOUNTER — Encounter: Payer: Self-pay | Admitting: Emergency Medicine

## 2022-09-09 ENCOUNTER — Emergency Department
Admission: EM | Admit: 2022-09-09 | Discharge: 2022-09-09 | Disposition: A | Discharge status: Home / Self Care | Payer: Medicaid Other | Attending: Emergency Medicine | Admitting: Emergency Medicine

## 2022-09-09 DIAGNOSIS — R0789 Other chest pain: Secondary | ICD-10-CM | POA: Insufficient documentation

## 2022-09-09 DIAGNOSIS — R000 Tachycardia, unspecified: Secondary | ICD-10-CM | POA: Diagnosis not present

## 2022-09-09 DIAGNOSIS — R079 Chest pain, unspecified: Secondary | ICD-10-CM

## 2022-09-09 DIAGNOSIS — J45909 Unspecified asthma, uncomplicated: Secondary | ICD-10-CM | POA: Insufficient documentation

## 2022-09-09 DIAGNOSIS — R202 Paresthesia of skin: Secondary | ICD-10-CM | POA: Diagnosis not present

## 2022-09-09 DIAGNOSIS — E876 Hypokalemia: Secondary | ICD-10-CM | POA: Diagnosis not present

## 2022-09-09 LAB — CBC
HCT: 38.9 % (ref 36.0–46.0)
Hemoglobin: 12.7 g/dL (ref 12.0–15.0)
MCH: 28.2 pg (ref 26.0–34.0)
MCHC: 32.6 g/dL (ref 30.0–36.0)
MCV: 86.4 fL (ref 80.0–100.0)
Platelets: 189 10*3/uL (ref 150–400)
RBC: 4.5 MIL/uL (ref 3.87–5.11)
RDW: 12.1 % (ref 11.5–15.5)
WBC: 4.7 10*3/uL (ref 4.0–10.5)
nRBC: 0 % (ref 0.0–0.2)

## 2022-09-09 LAB — BASIC METABOLIC PANEL WITH GFR
Anion gap: 10 (ref 5–15)
BUN: 12 mg/dL (ref 6–20)
CO2: 22 mmol/L (ref 22–32)
Calcium: 9.1 mg/dL (ref 8.9–10.3)
Chloride: 101 mmol/L (ref 98–111)
Creatinine, Ser: 1.02 mg/dL — ABNORMAL HIGH (ref 0.44–1.00)
GFR, Estimated: >60 mL/min
Glucose, Bld: 159 mg/dL — ABNORMAL HIGH (ref 70–99)
Potassium: 2.8 mmol/L — ABNORMAL LOW (ref 3.5–5.1)
Sodium: 133 mmol/L — ABNORMAL LOW (ref 135–145)

## 2022-09-09 LAB — HEPATIC FUNCTION PANEL
ALT: 12 U/L (ref 0–44)
AST: 22 U/L (ref 15–41)
Albumin: 4.1 g/dL (ref 3.5–5.0)
Alkaline Phosphatase: 65 U/L (ref 38–126)
Bilirubin, Direct: <0.1 mg/dL (ref 0.0–0.2)
Total Bilirubin: 0.6 mg/dL (ref 0.3–1.2)
Total Protein: 7.9 g/dL (ref 6.5–8.1)

## 2022-09-09 LAB — TROPONIN I (HIGH SENSITIVITY)
Troponin I (High Sensitivity): <2 ng/L (ref <18)
Troponin I (High Sensitivity): <2 ng/L (ref <18)

## 2022-09-09 LAB — D-DIMER, QUANTITATIVE: D-Dimer, Quant: <0.27 ug{FEU}/mL (ref 0.00–0.50)

## 2022-09-09 LAB — POC URINE PREG, ED: Preg Test, Ur: NEGATIVE

## 2022-09-09 LAB — MAGNESIUM: Magnesium: 2 mg/dL (ref 1.7–2.4)

## 2022-09-09 LAB — LIPASE, BLOOD: Lipase: 44 U/L (ref 11–51)

## 2022-09-09 MED ORDER — SODIUM CHLORIDE 0.9 % IV BOLUS
1000.0000 mL | Freq: Once | INTRAVENOUS | Status: AC
  Administered 2022-09-09: 1000 mL via INTRAVENOUS

## 2022-09-09 MED ORDER — POTASSIUM CHLORIDE CRYS ER 20 MEQ PO TBCR
40.0000 meq | EXTENDED_RELEASE_TABLET | Freq: Once | ORAL | Status: AC
  Administered 2022-09-09: 40 meq via ORAL
  Filled 2022-09-09: qty 2

## 2022-09-09 NOTE — Discharge Instructions (Addendum)
Fortunately your testing today in the emergency department did not show any signs of emergency conditions to explain your chest pain and shortness of breath like heart attack or blood clots.  Your potassium level was slightly low today.  You got some supplements in the emergency department.  See the attached documents about foods that are high in potassium and focus on these foods and have your doctor recheck this level on your next appointment.  I made a referral for primary doctor who will reach out to you to establish care.

## 2022-09-09 NOTE — ED Provider Notes (Signed)
[facility] Provider Note    Event Date/Time   First MD Initiated Contact with Patient 09/09/22 1929     (approximate)   History   Chest Pain   HPI  Lindsey Cain is a 37 y.o. female   Past medical history of remote asthma, who presents to the emergency department with chest pressure and shortness of breath transient while driving earlier today.  No residual symptoms now, asymptomatic.  She feels some tingling in her bilateral feet occasionally.  She was otherwise in her regular state of health with no recent medical illnesses, and denies any GI or GU complaints.  She has no leg pain or swelling.  No history of blood clots.  No longer on oral contraceptives.       Physical Exam   Triage Vital Signs: ED Triage Vitals  Enc Vitals Group     BP 09/09/22 1849 (!) 151/97     Pulse Rate 09/09/22 1849 (!) 117     Resp 09/09/22 1849 18     Temp 09/09/22 1849 98.4 F (36.9 C)     Temp Source 09/09/22 1849 Oral     SpO2 09/09/22 1849 100 %     Weight --      Height --      Head Circumference --      Peak Flow --      Pain Score 09/09/22 1848 6     Pain Loc --      Pain Edu? --      Excl. in [place]? --     Most recent vital signs: Vitals:   09/09/22 1849 09/09/22 2105  BP: (!) 151/97 116/82  Pulse: (!) 117 72  Resp: 18 16  Temp: 98.4 F (36.9 C) 98.6 F (37 C)  SpO2: 100% 100%    General: Awake, no distress.  CV:  Good peripheral perfusion.  Resp:  Normal effort.  Abd:  No distention.  Other:  Awake alert comfortable with normal vital signs, initially tachycardic in triage.  No hypoxemia no respiratory distress.  Clear lungs to auscultation, soft nontender abdomen, skin appears warm well-perfused, no leg swelling or pain on palpation.   ED Results / Procedures / Treatments   Labs (all labs ordered are listed, but only abnormal results are displayed) Labs Reviewed  BASIC METABOLIC PANEL - Abnormal; Notable for the following  components:      Result Value   Sodium 133 (*)    Potassium 2.8 (*)    Glucose, Bld 159 (*)    Creatinine, Ser 1.02 (*)    All other components within normal limits  CBC  D-DIMER, QUANTITATIVE  HEPATIC FUNCTION PANEL  LIPASE, BLOOD  MAGNESIUM  POC URINE PREG, ED  TROPONIN I (HIGH SENSITIVITY)  TROPONIN I (HIGH SENSITIVITY)     I ordered and reviewed the above labs they are notable for hypokalemia is 2.8.  EKG  ED ECG REPORT I, Pilar Jarvis, the attending physician, personally viewed and interpreted this ECG.   Date: 09/09/2022  EKG Time: 1849  Rate: 115  Rhythm: sinus tachycardia  Axis: nl  Intervals:none  ST&T Change: no stemi    RADIOLOGY I independently reviewed and interpreted chest x-ray and see no obvious focal consolidations or pneumothorax   PROCEDURES:  Critical Care performed: No  Procedures   MEDICATIONS ORDERED IN ED: Medications  sodium chloride 0.9 % bolus 1,000 mL (0 mLs Intravenous Stopped 09/09/22 2104)  potassium chloride SA (KLOR-CON M) CR tablet 40  mEq (40 mEq Oral Given 09/09/22 2101)     IMPRESSION / MDM / ASSESSMENT AND PLAN / ED COURSE  I reviewed the triage vital signs and the nursing notes.                                Patient's presentation is most consistent with acute presentation with potential threat to life or bodily function.  Differential diagnosis includes, but is not limited to, ACS, PE, respiratory infection, electrolyte derangement, dehydration   The patient is on the cardiac monitor to evaluate for evidence of arrhythmia and/or significant heart rate changes.  MDM: Transient chest pressure and shortness of breath in this 37 year old woman who initially presented tachycardic.  Mild shortness of breath ongoing no respiratory infectious symptoms.  Looks well nontoxic normalized vital signs without interventions.  Considered ACS but low cardiac risk factors nonischemic EKG, will check serial troponins.  Considered PE  given her tachycardia cannot PERC out, fortunately dimer negative, PE rule out.  Chest x-ray clear without signs of pneumothorax or bacterial infection.  Patient largely asymptomatic now, hypokalemia with repletion in the emergency department.  Follow-up repeat troponin if flat, plan for discharge and close PMD follow-up.  Given unremarkable workup as above doubt cardiopulmonary emergency at this time.      FINAL CLINICAL IMPRESSION(S) / ED DIAGNOSES   Final diagnoses:  Nonspecific chest pain  Hypokalemia     Rx / DC Orders   ED Discharge Orders          Ordered    Ambulatory Referral to Primary Care (Establish Care)        09/09/22 1948             Note:  This document was prepared using Dragon voice recognition software and may include unintentional dictation errors.    Pilar Jarvis, MD 09/09/22 2117

## 2022-09-09 NOTE — ED Triage Notes (Signed)
Patient to ED via POV for CP that started approx 1 hr ago. States pain on left side of chest and non-radiating. Describes as achy pain. Denies cardiac history.

## 2022-12-14 ENCOUNTER — Encounter: Payer: Self-pay | Admitting: Oncology

## 2023-01-24 ENCOUNTER — Ambulatory Visit: Payer: Medicaid Other | Admitting: Physician Assistant

## 2023-01-24 ENCOUNTER — Encounter: Payer: Self-pay | Admitting: Physician Assistant

## 2023-01-24 VITALS — BP 107/69 | HR 76 | Ht 66.0 in | Wt 120.0 lb

## 2023-01-24 DIAGNOSIS — D508 Other iron deficiency anemias: Secondary | ICD-10-CM

## 2023-01-24 DIAGNOSIS — N719 Inflammatory disease of uterus, unspecified: Secondary | ICD-10-CM

## 2023-01-24 DIAGNOSIS — Z136 Encounter for screening for cardiovascular disorders: Secondary | ICD-10-CM

## 2023-01-24 DIAGNOSIS — R899 Unspecified abnormal finding in specimens from other organs, systems and tissues: Secondary | ICD-10-CM

## 2023-01-24 DIAGNOSIS — Z7689 Persons encountering health services in other specified circumstances: Secondary | ICD-10-CM

## 2023-01-24 DIAGNOSIS — Z1159 Encounter for screening for other viral diseases: Secondary | ICD-10-CM

## 2023-01-24 DIAGNOSIS — J4520 Mild intermittent asthma, uncomplicated: Secondary | ICD-10-CM

## 2023-01-24 DIAGNOSIS — Z8742 Personal history of other diseases of the female genital tract: Secondary | ICD-10-CM | POA: Diagnosis not present

## 2023-01-24 NOTE — Progress Notes (Signed)
New patient visit  Patient: Lindsey Cain   DOB: 08-02-1985   37 y.o. Female  MRN: [id number] Visit Date: 01/24/2023  Today's healthcare provider: Debera Lat, PA-C   Chief Complaint  Patient presents with   New Patient (Initial Visit) Need establish Abdominal issue possible fibroids-seen in the hospital last year--cramping, abnormal bleeding(spotting) a week ago.   Subjective     Discussed the use of AI scribe software for clinical note transcription with the patient, who gave verbal consent to proceed.  History of Present Illness   The patient, with a history of asthma and iron deficiency anemia, presents with irregular menstrual bleeding for the past six years. The irregularity began after the birth of her last child, during which she developed severe abdominal pain, fever, and vomiting two weeks postpartum. She underwent a D&C for retained products of conception. Since then, her menstrual bleeding has been scant and dark, with occasional normal periods. She denies heavy bleeding. She also reports occasional abdominal pain during bowel movements but denies any other gastrointestinal symptoms. She has a remote history of asthma, which only affects her during strenuous activities like running. She uses an albuterol inhaler as needed, which is not prescribed to her but borrowed from her daughter.           01/24/2023    9:51 AM  Depression screen PHQ 2/9  Decreased Interest 0  Down, Depressed, Hopeless 0  PHQ - 2 Score 0  Altered sleeping 0  Tired, decreased energy 0  Change in appetite 0  Feeling bad or failure about yourself  0  Trouble concentrating 0  Moving slowly or fidgety/restless 0  Suicidal thoughts 0  PHQ-9 Score 0  Difficult doing work/chores Not difficult at all      01/24/2023    9:56 AM  GAD 7 : Generalized Anxiety Score  Nervous, Anxious, on Edge 0  Control/stop worrying 0  Worry too much - different things 0  Trouble relaxing 0  Restless 0   Easily annoyed or irritable 0  Afraid - awful might happen 0  Total GAD 7 Score 0  Anxiety Difficulty Not difficult at all    Medications: Outpatient Medications Prior to Visit  Medication Sig   [DISCONTINUED] acetaZOLAMIDE (DIAMOX) 125 MG tablet Take 4 tablets (500 mg total) by mouth 2 (two) times daily.   [DISCONTINUED] medroxyPROGESTERone (DEPO-PROVERA) 150 MG/ML injection Inject 1 mL (150 mg total) into the muscle once.   [DISCONTINUED] metoCLOPramide (REGLAN) 10 MG tablet Take 1 tablet (10 mg total) by mouth 3 (three) times daily with meals for 10 days.   [DISCONTINUED] naproxen (NAPROSYN) 500 MG tablet Take 500 mg by mouth 2 (two) times daily with a meal.   [DISCONTINUED] ondansetron (ZOFRAN-ODT) 4 MG disintegrating tablet Take 1 tablet (4 mg total) by mouth every 8 (eight) hours as needed for nausea or vomiting.   No facility-administered medications prior to visit.    Review of Systems  All other systems reviewed and are negative.  Except see HPI       Objective    BP 107/69   Pulse 76   Ht 5\' 6"  (1.676 m)   Wt 120 lb (54.4 kg)   LMP 01/10/2023   SpO2 97%   BMI 19.37 kg/m     Physical Exam Vitals reviewed.  Constitutional:      General: She is not in acute distress.    Appearance: Normal appearance. She is well-developed. She is not diaphoretic.  HENT:  Head: Normocephalic and atraumatic.  Eyes:     General: No scleral icterus.    Conjunctiva/sclera: Conjunctivae normal.  Neck:     Thyroid: No thyromegaly.  Cardiovascular:     Rate and Rhythm: Normal rate and regular rhythm.     Pulses: Normal pulses.     Heart sounds: Normal heart sounds. No murmur heard. Pulmonary:     Effort: Pulmonary effort is normal. No respiratory distress.     Breath sounds: Normal breath sounds. No wheezing, rhonchi or rales.  Musculoskeletal:     Cervical back: Neck supple.     Right lower leg: No edema.     Left lower leg: No edema.  Lymphadenopathy:     Cervical:  No cervical adenopathy.  Skin:    General: Skin is warm and dry.     Findings: No rash.  Neurological:     Mental Status: She is alert and oriented to person, place, and time. Mental status is at baseline.  Psychiatric:        Mood and Affect: Mood normal.        Behavior: Behavior normal.      No results found for any visits on 01/24/23.  Assessment & Plan       Other iron deficiency anemia In the past Initial workup - CBC with Differential/Platelet - Comprehensive metabolic panel - Ambulatory referral to Obstetrics / Gynecology Advised healthy diet and daily exercise Will Follow-up  History of abnormal cervical Pap smear Endomyometritis Menstrual Irregularity Chronic Reports irregular, dark, scant bleeding for the past 6 years since the birth of her last child. History of D&C for retained products of conception postpartum. Possible fibroid or other pelvic mass noted in the past but not definitively diagnosed. -Refer to OBGYN for further evaluation and management. -Order complete blood count to assess for anemia. - Ambulatory referral to Obstetrics / Gynecology  Encounter for special screening examination for cardiovascular disorder - Lipid panel  Need for hepatitis C screening test Low risk screening - Hepatitis C antibody   Abnormal laboratory test result Will repeat - CBC with Differential/Platelet - Comprehensive metabolic panel Will reassess  Asthma Chronic, mild Reports only needing Albuterol inhaler during exertion. No daily controller medication. -Continue current management with Albuterol as needed. Uses her child's albuterol  General Health Maintenance -Order comprehensive metabolic panel, lipid panel, and Hepatitis C screening. -Schedule physical exam for January 2025.     Establish care Welcomed to our clinic Reviewed past medical hx, social hx, family hx and surgical hx Pt advised to sign a release form for her old records Including her  vaccination records   Return in about 2 months (around 03/26/2023) for CPE.     The patient was advised to call back or seek an in-person evaluation if the symptoms worsen or if the condition fails to improve as anticipated.  I discussed the assessment and treatment plan with the patient. The patient was provided an opportunity to ask questions and all were answered. The patient agreed with the plan and demonstrated an understanding of the instructions.  I, Debera Lat, PA-C have reviewed all documentation for this visit. The documentation on  01/24/23 for the exam, diagnosis, procedures, and orders are all accurate and complete.  Debera Lat, [facility], MMS [facility] [phone] (phone) [phone] (fax)  [facility]

## 2023-01-25 LAB — CBC WITH DIFFERENTIAL/PLATELET
Basophils Absolute: 0 10*3/uL (ref 0.0–0.2)
Basos: 1 %
EOS (ABSOLUTE): 0 10*3/uL (ref 0.0–0.4)
Eos: 1 %
Hematocrit: 38 % (ref 34.0–46.6)
Hemoglobin: 12.3 g/dL (ref 11.1–15.9)
Immature Grans (Abs): 0 10*3/uL (ref 0.0–0.1)
Immature Granulocytes: 0 %
Lymphocytes Absolute: 1.3 10*3/uL (ref 0.7–3.1)
Lymphs: 51 %
MCH: 28.5 pg (ref 26.6–33.0)
MCHC: 32.4 g/dL (ref 31.5–35.7)
MCV: 88 fL (ref 79–97)
Monocytes Absolute: 0.2 10*3/uL (ref 0.1–0.9)
Monocytes: 8 %
Neutrophils Absolute: 1 10*3/uL — ABNORMAL LOW (ref 1.4–7.0)
Neutrophils: 39 %
Platelets: 180 10*3/uL (ref 150–450)
RBC: 4.31 x10E6/uL (ref 3.77–5.28)
RDW: 11.9 % (ref 11.7–15.4)
WBC: 2.6 10*3/uL — ABNORMAL LOW (ref 3.4–10.8)

## 2023-01-25 LAB — COMPREHENSIVE METABOLIC PANEL
ALT: 11 [IU]/L (ref 0–32)
AST: 22 [IU]/L (ref 0–40)
Albumin: 4.3 g/dL (ref 3.9–4.9)
Alkaline Phosphatase: 75 [IU]/L (ref 44–121)
BUN/Creatinine Ratio: 7 — ABNORMAL LOW (ref 9–23)
BUN: 6 mg/dL (ref 6–20)
Bilirubin Total: 0.4 mg/dL (ref 0.0–1.2)
CO2: 22 mmol/L (ref 20–29)
Calcium: 8.9 mg/dL (ref 8.7–10.2)
Chloride: 104 mmol/L (ref 96–106)
Creatinine, Ser: 0.82 mg/dL (ref 0.57–1.00)
Globulin, Total: 3 g/dL (ref 1.5–4.5)
Glucose: 88 mg/dL (ref 70–99)
Potassium: 4.5 mmol/L (ref 3.5–5.2)
Sodium: 140 mmol/L (ref 134–144)
Total Protein: 7.3 g/dL (ref 6.0–8.5)
eGFR: 95 mL/min/{1.73_m2} (ref >59)

## 2023-01-25 LAB — LIPID PANEL
Chol/HDL Ratio: 2.8 ratio (ref 0.0–4.4)
Cholesterol, Total: 197 mg/dL (ref 100–199)
HDL: 71 mg/dL (ref >39)
LDL Chol Calc (NIH): 117 mg/dL — ABNORMAL HIGH (ref 0–99)
Triglycerides: 46 mg/dL (ref 0–149)
VLDL Cholesterol Cal: 9 mg/dL (ref 5–40)

## 2023-01-25 LAB — HEPATITIS C ANTIBODY: Hep C Virus Ab: NONREACTIVE

## 2023-02-14 ENCOUNTER — Encounter: Payer: Self-pay | Admitting: Certified Nurse Midwife

## 2023-02-14 ENCOUNTER — Ambulatory Visit (INDEPENDENT_AMBULATORY_CARE_PROVIDER_SITE_OTHER): Payer: Medicaid Other | Admitting: Certified Nurse Midwife

## 2023-02-14 ENCOUNTER — Other Ambulatory Visit (HOSPITAL_COMMUNITY)
Admission: RE | Admit: 2023-02-14 | Discharge: 2023-02-14 | Disposition: A | Discharge status: Home / Self Care | Payer: Medicaid Other | Source: Ambulatory Visit | Attending: Certified Nurse Midwife | Admitting: Certified Nurse Midwife

## 2023-02-14 VITALS — BP 120/83 | HR 86 | Ht 66.0 in | Wt 117.0 lb

## 2023-02-14 DIAGNOSIS — N898 Other specified noninflammatory disorders of vagina: Secondary | ICD-10-CM | POA: Insufficient documentation

## 2023-02-14 DIAGNOSIS — Z3202 Encounter for pregnancy test, result negative: Secondary | ICD-10-CM

## 2023-02-14 DIAGNOSIS — N912 Amenorrhea, unspecified: Secondary | ICD-10-CM | POA: Diagnosis not present

## 2023-02-14 DIAGNOSIS — N946 Dysmenorrhea, unspecified: Secondary | ICD-10-CM | POA: Insufficient documentation

## 2023-02-14 LAB — POCT URINE PREGNANCY: Preg Test, Ur: NEGATIVE

## 2023-02-14 MED ORDER — NORELGESTROMIN-ETH ESTRADIOL 150-35 MCG/24HR TD PTWK
1.0000 | MEDICATED_PATCH | TRANSDERMAL | 4 refills | Status: AC
Start: 2023-02-14 — End: ?

## 2023-02-14 NOTE — Assessment & Plan Note (Signed)
Start Xulane patch, consider hormonal IUD or depo if inadequate pain management.

## 2023-02-14 NOTE — Progress Notes (Signed)
Debera Lat, PA-C   Chief Complaint  Patient presents with   Menstrual Problem    HPI:      Lindsey Cain is a 37 y.o. [id number] whose LMP was Patient's last menstrual period was 01/10/2023., presents today for painful periods & follow up on fibroids. Cycles are regular, every 28-30d, lasting 5d. Her cramping is severe and starts 2d prior to bleeding then continues for first three days of cycle. She is currently abstinent, does not desire pregnancy. 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 Last ultrasound 11/23 which showed previously seen fibroid had decreased in size. Also reports increased vaginal discharge, thick, white & clumpy. Desires STI screening as well as checking for bv/yeast.   Patient Active Problem List   Diagnosis Date Noted   Dysmenorrhea 02/14/2023   Uterine mass 01/04/2019   Endomyometritis 10/22/2015   Postpartum care following vaginal delivery 10/08/2015   Back pain affecting pregnancy in second trimester 10/08/2015   Iron deficiency anemia 08/28/2015   Difficulty in swallowing 05/22/2015   History of abnormal cervical Pap smear 04/24/2015    Past Surgical History:  Procedure Laterality Date   DILATION AND CURETTAGE OF UTERUS N/A 10/22/2015   Procedure: DILATATION AND CURETTAGE;  Surgeon: Nadara Mustard, MD;  Location: [facility] ORS;  Service: Gynecology;  Laterality: N/A;   HYSTEROSCOPY WITH D & C N/A 01/04/2019   Procedure: DILATATION AND CURETTAGE /HYSTEROSCOPY, REMOVAL OF UTERINE MASS;  Surgeon: Conard Novak, MD;  Location: [facility] ORS;  Service: Gynecology;  Laterality: N/A;    Family History  Problem Relation Age of Onset   Lupus Father    Diabetes Paternal Grandfather    Hypertension Paternal Grandfather     Social History   Socioeconomic History   Marital status: Single    Spouse name: Not on file   Number of children: Not on file   Years of education: Not on file   Highest education level:  Not on file  Occupational History   Not on file  Tobacco Use   Smoking status: Never   Smokeless tobacco: Never  Vaping Use   Vaping status: Never Used  Substance and Sexual Activity   Alcohol use: No   Drug use: No   Sexual activity: Not Currently    Birth control/protection: None  Other Topics Concern   Not on file  Social History Narrative   Not on file   Social Determinants of Health   Financial Resource Strain: Not on file  Food Insecurity: Not on file  Transportation Needs: Not on file  Physical Activity: Not on file  Stress: Not on file  Social Connections: Not on file  Intimate Partner Violence: Not on file    No outpatient medications prior to visit.   No facility-administered medications prior to visit.      ROS:  Review of Systems  Constitutional: Negative.   Respiratory: Negative.    Cardiovascular: Negative.   Genitourinary:  Positive for menstrual problem.     OBJECTIVE:   Vitals:  BP 120/83   Pulse 86   Ht 5\' 6"  (1.676 m)   Wt 117 lb (53.1 kg)   LMP 01/10/2023   BMI 18.88 kg/m   Physical Exam Vitals reviewed.  Constitutional:      Appearance: Normal appearance.  Cardiovascular:     Rate and Rhythm: Normal rate.  Pulmonary:     Effort: Pulmonary effort is normal.  Neurological:     General: No  focal deficit present.     Mental Status: She is alert and oriented to person, place, and time.  Psychiatric:        Mood and Affect: Mood normal.        Behavior: Behavior normal.     Results: Results for orders placed or performed in visit on 02/14/23 (from the past 24 hour(s))  POCT urine pregnancy     Status: None   Collection Time: 02/14/23  9:42 AM  Result Value Ref Range   Preg Test, Ur Negative Negative     Assessment/Plan: Dysmenorrhea - Plan: POCT urine pregnancy, norelgestromin-ethinyl estradiol [employer]ico) 150-35 MCG/24HR transdermal patch  Vaginal discharge - Plan: Cervicovaginal ancillary only Options for pain  management with cycle reviewed, including ibuprofen 800mg  every 8h beginning 2d prior to cycle & continuing for first 2 days of bleeding, hormonal/contraceptive methods for management including pill, patch, ring, depo injection, implant & IUD reviewed. Desires to trial patch. Non-smoker, no history of blood clots, risks/benefits of each option reviewed.  Start xulane patch, if minimal impact on pain with cycles consider depo or Mirena IUD.   Meds ordered this encounter  Medications   norelgestromin-ethinyl estradiol [employer]ico) 150-35 MCG/24HR transdermal patch    Sig: Place 1 patch onto the skin once a week. Remain patch free on fourth week.    Dispense:  9 patch    Refill:  4    Order Specific Question:   Supervising Provider    Answer:   Hildred Laser [[id number]]     Dominica Severin, CNM 02/14/2023 12:59 PM

## 2023-02-15 LAB — CERVICOVAGINAL ANCILLARY ONLY
Bacterial Vaginitis (gardnerella): NEGATIVE
Candida Glabrata: NEGATIVE
Candida Vaginitis: NEGATIVE
Chlamydia: NEGATIVE
Comment: NEGATIVE
Comment: NEGATIVE
Comment: NEGATIVE
Comment: NEGATIVE
Comment: NEGATIVE
Comment: NORMAL
Neisseria Gonorrhea: NEGATIVE
Trichomonas: NEGATIVE

## 2023-03-28 ENCOUNTER — Ambulatory Visit: Payer: Medicaid Other | Admitting: Physician Assistant

## 2023-04-05 ENCOUNTER — Ambulatory Visit (INDEPENDENT_AMBULATORY_CARE_PROVIDER_SITE_OTHER): Payer: Medicaid Other | Admitting: Physician Assistant

## 2023-04-05 VITALS — BP 102/75 | HR 76 | Ht 66.0 in | Wt 120.1 lb

## 2023-04-05 DIAGNOSIS — G8929 Other chronic pain: Secondary | ICD-10-CM

## 2023-04-05 DIAGNOSIS — R12 Heartburn: Secondary | ICD-10-CM | POA: Diagnosis not present

## 2023-04-05 DIAGNOSIS — M545 Low back pain, unspecified: Secondary | ICD-10-CM | POA: Diagnosis not present

## 2023-04-05 MED ORDER — OMEPRAZOLE 20 MG PO CPDR
20.0000 mg | DELAYED_RELEASE_CAPSULE | Freq: Two times a day (BID) | ORAL | 3 refills | Status: DC
Start: 2023-04-05 — End: 2023-04-07

## 2023-04-05 NOTE — Progress Notes (Unsigned)
Established patient visit  Patient: Lindsey Cain   DOB: 08/31/1985   38 y.o. Female  MRN: [id number] Visit Date: 04/05/2023  Today's healthcare provider: Debera Lat, PA-C   Chief Complaint  Patient presents with   heartburn   Subjective       Discussed the use of AI scribe software for clinical note transcription with the patient, who gave verbal consent to proceed.  History of Present Illness   The patient, with a history of back pain since pregnancy seven years ago, presents with ongoing chest pain and back pain. They report a sensation of 'something sitting on my chest' and describe the discomfort as 'heartburn' or 'acid reflux.' They have not been taking any prescribed heartburn medication due to uncertainty about their condition. The chest pain is associated with eating, leading to burping and a feeling of nausea, which sometimes prevents them from eating. They deny any specific food triggers, but note that the symptoms can occur in the morning upon waking.  The patient's back pain has been ongoing for several years, with recent worsening. They describe the pain as being in the lower back and it is exacerbated by lying flat. They have tried naproxen for the pain, but report it provides little relief. They have not sought physical therapy or chiropractic care for the back pain.           01/24/2023    9:51 AM  Depression screen PHQ 2/9  Decreased Interest 0  Down, Depressed, Hopeless 0  PHQ - 2 Score 0  Altered sleeping 0  Tired, decreased energy 0  Change in appetite 0  Feeling bad or failure about yourself  0  Trouble concentrating 0  Moving slowly or fidgety/restless 0  Suicidal thoughts 0  PHQ-9 Score 0  Difficult doing work/chores Not difficult at all      01/24/2023    9:56 AM  GAD 7 : Generalized Anxiety Score  Nervous, Anxious, on Edge 0  Control/stop worrying 0  Worry too much - different things 0  Trouble relaxing 0  Restless 0  Easily annoyed  or irritable 0  Afraid - awful might happen 0  Total GAD 7 Score 0  Anxiety Difficulty Not difficult at all    Medications: Outpatient Medications Prior to Visit  Medication Sig   norelgestromin-ethinyl estradiol [facility]) 150-35 MCG/24HR transdermal patch Place 1 patch onto the skin once a week. Remain patch free on fourth week. (Patient not taking: Reported on 04/05/2023)   No facility-administered medications prior to visit.    Review of Systems All negative Except see HPI       Objective    BP 102/75   Pulse 76   Ht 5\' 6"  (1.676 m)   Wt 120 lb 1.6 oz (54.5 kg)   SpO2 100%   BMI 19.38 kg/m     Physical Exam   No results found for any visits on 04/05/23.      Assessment and Plan    Heartburn  Could be due to Gastroesophageal Reflux Disease (GERD) Reports of chest pain, heartburn, and nausea, particularly after eating. No medication currently being taken for symptoms. Discussed lifestyle modifications including diet changes and avoiding lying down soon after eating. -Order H. pylori test to rule out infection. -Prescribe heartburn medication, to be taken twice daily. -Schedule follow-up appointment in two weeks to assess response to medication. Pt was seen at ED on 1/8 25 and evaluated for chest pain. Workup was grossly unremarkable for acs, pneumonia or  pneumothorax, pericarditis and myocarditis.   Chronic Back Pain Reports of ongoing back pain since pregnancy, approximately 7 years ago. No relief with Naproxen. No recent trauma or injury reported. No radiating pain suggestive of sciatica. -Refer to physical therapy for pain management and strengthening exercises. -Recommend over-the-counter Voltaren gel for topical pain relief. -Suggest lifestyle modifications including regular exercise and movement.  Follow-up in 2 weeks to assess response to GERD medication and progress with physical therapy for back pain.      Orders Placed This Encounter  Procedures    H. pylori breath test   Ambulatory referral to Physical Therapy    Referral Priority:   Routine    Referral Type:   Physical Medicine    Referral Reason:   Specialty Services Required    Requested Specialty:   Physical Therapy    Number of Visits Requested:   1    Return in about 2 weeks (around 04/19/2023) for gerd fu.   The patient was advised to call back or seek an in-person evaluation if the symptoms worsen or if the condition fails to improve as anticipated.  I discussed the assessment and treatment plan with the patient. The patient was provided an opportunity to ask questions and all were answered. The patient agreed with the plan and demonstrated an understanding of the instructions.  I, Debera Lat, PA-C have reviewed all documentation for this visit. The documentation on 04/05/2023  for the exam, diagnosis, procedures, and orders are all accurate and complete.  Debera Lat, [facility], MMS [facility] [phone] (phone) [phone] (fax)  [facility]

## 2023-04-06 LAB — H. PYLORI BREATH TEST: H pylori Breath Test: POSITIVE — AB

## 2023-04-07 ENCOUNTER — Encounter: Payer: Self-pay | Admitting: Physical Therapy

## 2023-04-07 ENCOUNTER — Telehealth: Payer: Self-pay | Admitting: Physician Assistant

## 2023-04-07 ENCOUNTER — Other Ambulatory Visit: Payer: Self-pay | Admitting: Physician Assistant

## 2023-04-07 ENCOUNTER — Encounter: Payer: Self-pay | Admitting: Physician Assistant

## 2023-04-07 ENCOUNTER — Ambulatory Visit: Payer: Medicaid Other | Attending: Physician Assistant | Admitting: Physical Therapy

## 2023-04-07 DIAGNOSIS — A048 Other specified bacterial intestinal infections: Secondary | ICD-10-CM

## 2023-04-07 DIAGNOSIS — M545 Low back pain, unspecified: Secondary | ICD-10-CM | POA: Diagnosis present

## 2023-04-07 DIAGNOSIS — G8929 Other chronic pain: Secondary | ICD-10-CM | POA: Insufficient documentation

## 2023-04-07 MED ORDER — BISMUTH SUBSALICYLATE 262 MG PO TABS
300.0000 mg | ORAL_TABLET | Freq: Four times a day (QID) | ORAL | 0 refills | Status: AC
Start: 2023-04-07 — End: 2023-04-17

## 2023-04-07 MED ORDER — METRONIDAZOLE 500 MG PO TABS
500.0000 mg | ORAL_TABLET | Freq: Two times a day (BID) | ORAL | 0 refills | Status: AC
Start: 2023-04-07 — End: 2023-04-14

## 2023-04-07 MED ORDER — PANTOPRAZOLE SODIUM 40 MG PO TBEC
40.0000 mg | DELAYED_RELEASE_TABLET | Freq: Two times a day (BID) | ORAL | 0 refills | Status: DC
Start: 2023-04-07 — End: 2023-07-08

## 2023-04-07 MED ORDER — TETRACYCLINE HCL 500 MG PO CAPS
500.0000 mg | ORAL_CAPSULE | Freq: Four times a day (QID) | ORAL | 0 refills | Status: AC
Start: 2023-04-07 — End: 2023-04-17

## 2023-04-07 NOTE — Progress Notes (Signed)
Please let pt know that she has H pylori infection and needs to have treatment with 4 medications.

## 2023-04-07 NOTE — Telephone Encounter (Signed)
[facility] pharmacy is requesting refill tetracycline (SUMYCIN) 500 MG capsule  Please advise

## 2023-04-07 NOTE — Telephone Encounter (Signed)
Filled via separate encounter.

## 2023-04-07 NOTE — Therapy (Signed)
OUTPATIENT PHYSICAL THERAPY THORACOLUMBAR EVALUATION   Patient Name: Lindsey Cain MRN: [id number] DOB:Oct 03, 1985, 38 y.o., female Today's Date: 04/07/2023  END OF SESSION:  PT End of Session - 04/07/23 1331     Visit Number 1    Number of Visits 20    Date for PT Re-Evaluation 06/16/23    Authorization Type [employer]    Authorization - Visit Number 1    Authorization - Number of Visits 20    Progress Note Due on Visit 10    PT Start Time 1115    PT Stop Time 1200    PT Time Calculation (min) 45 min    Activity Tolerance Patient limited by pain    Behavior During Therapy WFL for tasks assessed/performed             Past Medical History:  Diagnosis Date   Anemia    Asthma    Herpes genitalis    Past Surgical History:  Procedure Laterality Date   DILATION AND CURETTAGE OF UTERUS N/A 10/22/2015   Procedure: DILATATION AND CURETTAGE;  Surgeon: Nadara Mustard, MD;  Location: [facility] ORS;  Service: Gynecology;  Laterality: N/A;   HYSTEROSCOPY WITH D & C N/A 01/04/2019   Procedure: DILATATION AND CURETTAGE /HYSTEROSCOPY, REMOVAL OF UTERINE MASS;  Surgeon: Conard Novak, MD;  Location: [facility] OR[facility];  Service: Gynecology;  Laterality: N/A;   Patient Active Problem List   Diagnosis Date Noted   Dysmenorrhea 02/14/2023   Uterine mass 01/04/2019   Endomyometritis 10/22/2015   Postpartum care following vaginal delivery 10/08/2015   Back pain affecting pregnancy in second trimester 10/08/2015   Iron deficiency anemia 08/28/2015   Difficulty in swallowing 05/22/2015   History of abnormal cervical Pap smear 04/24/2015    PCP: Dr. Debera Lat   REFERRING PROVIDER: Dr. Debera Lat   REFERRING DIAG: M54.50,G89.29 (ICD-10-CM) - Chronic bilateral low back pain without sciatica  Rationale for Evaluation and Treatment: Rehabilitation  THERAPY DIAG:  Nonspecific low back pain  ONSET DATE: 2020   SUBJECTIVE:                                                                                                                                                                                            SUBJECTIVE STATEMENT: See pertinent  history   PERTINENT HISTORY:  Pt states that she started having low after having her last daughter about 7 years ago. She describes that pain radiates down the front of her thighs. She mostly experiences the pain when getting up from laying down.   PAIN:  Are you having pain? Yes: NPRS scale: 6/10  Pain  location: L2-L5 central spinous  Pain description: Throbbing and shooting and numbness and tingling  Aggravating factors: Standing up from a seated position  Relieving factors: Laying down or on side   PRECAUTIONS: None  RED FLAGS: None   WEIGHT BEARING RESTRICTIONS: No  FALLS:  Has patient fallen in last 6 months? 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   OBJECTIVE:  Note: Objective measures were completed at Evaluation unless otherwise noted.  VITALS: BP 116/83 HR 77 SpO2 100%  DIAGNOSTIC FINDINGS:  No imaging listed in the note   PATIENT SURVEYS:  NDI NT   COGNITION: Overall cognitive status: Within functional limits for tasks assessed     SENSATION: WFL  MUSCLE LENGTH: Hamstrings: Right 90 deg; Left 90 deg Thomas test: Not tested   POSTURE: No Significant postural limitations  PALPATION: L2-L5 central spinous process   LUMBAR ROM:   AROM eval  Flexion 100%  Extension 100%*  Right lateral flexion 100%  Left lateral flexion 100%  Right rotation 100%  Left rotation 100%   (Blank rows = not  tested)  LOWER EXTREMITY ROM:     Active  Right eval Left eval  Hip flexion 4 4  Supine Hip extension 3 3  Hip abduction 4- 4-  Hip adduction 4- 4-  Hip internal rotation    Hip external rotation    Knee flexion    Knee extension    Ankle dorsiflexion    Ankle plantarflexion    Ankle inversion    Ankle eversion     (Blank rows = not tested)  LOWER EXTREMITY MMT:     MMT Right eval Left eval  Hip flexion 4 4  Supine Hip extension 4 4  Hip abduction 4- 4-  Hip adduction 4- 4-  Hip internal rotation    Hip external rotation    Knee flexion    Knee extension    Ankle dorsiflexion    Ankle plantarflexion    Ankle inversion    Ankle eversion       LUMBAR SPECIAL TESTS:  Straight leg raise test: + RLE, FABER test: + RLE, and FADIR Negative Bilaterally    GAIT: Distance walked: 20 ft  Assistive device utilized: None Level of assistance: Complete Independence Comments: No gait abnormalities noted   TREATMENT DATE:   Eval 04/07/23     Lower Trunk rotation 1 x 10  PATIENT EDUCATION:  Education details: Form and technique for correct performance of exercise and explanation about underlying pathology  Person educated: Patient Education method: Explanation, Demonstration, Verbal cues, and Handouts Education comprehension: verbalized understanding, returned demonstration, and verbal cues required  HOME EXERCISE PROGRAM: Access Code: [email or web address] URL: [email or web address] Date: 04/07/2023 Prepared by: Ellin Goodie  Exercises - Supine Lower Trunk Rotation  - 1 x daily - 2 sets - 10 reps - 3 sec  hold - Sidelying Quadriceps Stretch with Strap  - 1 x daily - 3 reps - 30-60 sec  hold - Mini Squat  - 3-4 x weekly - 2 sets - 10 reps  ASSESSMENT:  CLINICAL IMPRESSION: Patient is a 38 y.o. AA female who was seen today for  physical therapy evaluation and treatment for bilateral low back pain. She shows signs and symptoms that make her most appropriate for the movement control group with moderate low back pain and disability. She also exhibits decreased hip strength and flexibility and increased low back pain with a directional preference for flexion based exercises. She will benefit from skilled PT to address the aforementioned deficits to return to standing for prolonged periods of time to complete cashier duties at [employer] and cleaning duties as mother of four like cooking and cleaning.   OBJECTIVE IMPAIRMENTS: decreased strength, impaired flexibility, and pain.   ACTIVITY LIMITATIONS: bending, standing, locomotion level, and caring for others  PARTICIPATION LIMITATIONS: meal prep, cleaning, laundry, and occupation  PERSONAL FACTORS: Past/current experiences, Social background, and Time since onset of injury/illness/exacerbation are also affecting patient's functional outcome.   REHAB POTENTIAL: Good  CLINICAL DECISION MAKING: Stable/uncomplicated  EVALUATION COMPLEXITY: Low   GOALS: Goals reviewed with patient? No  SHORT TERM GOALS: Target date: 04/21/2023  Patient will demonstrate undestanding of home exercise plan by performing exercises correctly with evidence of good carry over with min to no verbal or tactile cues .   Baseline: NT  Goal status: INITIAL  2.  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   (Copay et  al, 2008) Baseline: NT  Goal status: INITIAL  2.  Patient will demonstrate an improvement in her hip strength by 1/3 grade MMT (ie 4- to 4) and abdominal strength by >=1 grade for improved spinal stability to decrease her symptoms for improved ability to stand for longer periods of time.  Baseline: Hip Flex R/L 4/4, Supine Hip Ext R/L 4/4, Hip Add R/L 4-/4-, Hip Abd R/L 4-/4-, Sahrmann test  Goal status: INITIAL  PLAN:  PT FREQUENCY: 1-2x/week  PT DURATION: 10 weeks  PLANNED INTERVENTIONS: 97164- PT Re-evaluation, 97110-Therapeutic exercises, 97530- Therapeutic activity, 97112- Neuromuscular re-education, 97535- Self Care, 54098- Manual therapy, [id number]- Aquatic Therapy, 97014- Electrical stimulation (unattended), [id number]- Electrical stimulation (manual), Patient/Family education, Balance training, Stair training, Dry Needling, Joint mobilization, Joint manipulation, Spinal manipulation, Spinal mobilization, Cryotherapy, and Moist heat.  PLAN FOR NEXT SESSION: Follow up on fall that resulted in low back pain, check in about writing letter, Sahrmann Test, and continue to progress abdominal and hip  strength test   Ellin Goodie PT, DPT  [facility] [address], [place], [address] Phone: [phone]   Fax:  [phone]

## 2023-04-19 ENCOUNTER — Ambulatory Visit: Payer: Self-pay | Admitting: Physician Assistant

## 2023-04-19 DIAGNOSIS — A048 Other specified bacterial intestinal infections: Secondary | ICD-10-CM

## 2023-04-19 DIAGNOSIS — G8929 Other chronic pain: Secondary | ICD-10-CM

## 2023-04-19 DIAGNOSIS — R12 Heartburn: Secondary | ICD-10-CM

## 2023-04-21 ENCOUNTER — Ambulatory Visit: Payer: Medicaid Other | Admitting: Physical Therapy

## 2023-04-21 ENCOUNTER — Telehealth: Payer: Self-pay | Admitting: Physical Therapy

## 2023-04-21 NOTE — Telephone Encounter (Signed)
Called pt to inquire about absence from PT. Pt reports she forgot about apt and she will be at next one. PT reviewed next apt time with patient to ensure she knows when it is.

## 2023-05-03 ENCOUNTER — Ambulatory Visit: Payer: Medicaid Other | Attending: Physician Assistant | Admitting: Physical Therapy

## 2023-05-10 ENCOUNTER — Ambulatory Visit: Payer: Medicaid Other | Admitting: Physical Therapy

## 2023-05-17 ENCOUNTER — Encounter: Payer: Medicaid Other | Admitting: Physical Therapy

## 2023-05-24 ENCOUNTER — Encounter: Payer: Medicaid Other | Admitting: Physical Therapy

## 2023-05-26 ENCOUNTER — Encounter: Payer: Medicaid Other | Admitting: Physical Therapy

## 2023-05-30 ENCOUNTER — Encounter: Payer: Medicaid Other | Admitting: Physical Therapy

## 2023-06-02 ENCOUNTER — Encounter: Payer: Medicaid Other | Admitting: Physical Therapy

## 2023-06-06 ENCOUNTER — Encounter: Payer: Medicaid Other | Admitting: Physical Therapy

## 2023-06-14 ENCOUNTER — Encounter: Payer: Medicaid Other | Admitting: Physical Therapy

## 2023-06-16 ENCOUNTER — Encounter: Payer: Medicaid Other | Admitting: Physical Therapy

## 2023-06-20 ENCOUNTER — Encounter: Payer: Medicaid Other | Admitting: Physical Therapy

## 2023-06-22 ENCOUNTER — Encounter: Payer: Medicaid Other | Admitting: Physical Therapy

## 2023-06-27 ENCOUNTER — Encounter: Payer: Medicaid Other | Admitting: Physical Therapy

## 2023-06-29 ENCOUNTER — Encounter: Payer: Medicaid Other | Admitting: Physical Therapy

## 2023-07-04 ENCOUNTER — Encounter: Payer: Medicaid Other | Admitting: Physical Therapy

## 2023-07-04 ENCOUNTER — Telehealth: Payer: Self-pay

## 2023-07-04 NOTE — Telephone Encounter (Unsigned)
 Copied from CRM [id number]. Topic: Appointments - Scheduling Inquiry for Clinic >> Jul 04, 2023  3:22 PM Sasha H wrote: Reason for CRM: pt called in wanting to schedule physical but I am only getting options for January 2026. Please give pt a call back.

## 2023-07-05 NOTE — Telephone Encounter (Signed)
 Mychart message sent about cpe.  She just had one 04/05/23.

## 2023-07-07 ENCOUNTER — Encounter: Payer: Medicaid Other | Admitting: Physical Therapy

## 2023-07-08 ENCOUNTER — Encounter: Payer: Self-pay | Admitting: Physician Assistant

## 2023-07-08 ENCOUNTER — Ambulatory Visit (INDEPENDENT_AMBULATORY_CARE_PROVIDER_SITE_OTHER): Admitting: Physician Assistant

## 2023-07-08 VITALS — BP 93/58 | HR 76 | Resp 16 | Ht 64.0 in | Wt 122.4 lb

## 2023-07-08 DIAGNOSIS — R0989 Other specified symptoms and signs involving the circulatory and respiratory systems: Secondary | ICD-10-CM | POA: Insufficient documentation

## 2023-07-08 DIAGNOSIS — J4520 Mild intermittent asthma, uncomplicated: Secondary | ICD-10-CM | POA: Diagnosis not present

## 2023-07-08 DIAGNOSIS — Z1329 Encounter for screening for other suspected endocrine disorder: Secondary | ICD-10-CM

## 2023-07-08 DIAGNOSIS — Z0001 Encounter for general adult medical examination with abnormal findings: Secondary | ICD-10-CM | POA: Diagnosis not present

## 2023-07-08 DIAGNOSIS — K219 Gastro-esophageal reflux disease without esophagitis: Secondary | ICD-10-CM | POA: Insufficient documentation

## 2023-07-08 DIAGNOSIS — Z13228 Encounter for screening for other metabolic disorders: Secondary | ICD-10-CM

## 2023-07-08 DIAGNOSIS — Z0000 Encounter for general adult medical examination without abnormal findings: Secondary | ICD-10-CM | POA: Insufficient documentation

## 2023-07-08 DIAGNOSIS — A048 Other specified bacterial intestinal infections: Secondary | ICD-10-CM | POA: Insufficient documentation

## 2023-07-08 DIAGNOSIS — Z136 Encounter for screening for cardiovascular disorders: Secondary | ICD-10-CM

## 2023-07-08 MED ORDER — PANTOPRAZOLE SODIUM 40 MG PO TBEC
40.0000 mg | DELAYED_RELEASE_TABLET | Freq: Every day | ORAL | 1 refills | Status: AC
Start: 2023-07-08 — End: ?

## 2023-07-08 NOTE — Progress Notes (Signed)
 Complete physical exam  Patient: Lindsey Cain   DOB: 1985/04/23   38 y.o. Female  MRN: [id number] Visit Date: 07/08/2023  Today's healthcare provider: Blane Bunting, PA-C   Chief Complaint  Patient presents with   Annual Exam    CPE .Aaron Aas No other concerns   Subjective    Lindsey Cain is a 38 y.o. female who presents today for a complete physical exam.   Discussed the use of AI scribe software for clinical note transcription with the patient, who gave verbal consent to proceed.  History of Present Illness  Pt presents with chest pain and heart palpitations.  She experiences intermittent chest pain that began this morning and was also present yesterday. The pain is noticeable but not severe and sometimes shifts from one side to the other. She associates the pain with dietary factors such as caffeine and spicy foods, suggesting a possible link to acid reflux.  Heart palpitations, described as 'heart flutters', occur sometimes daily. A previous cardiology evaluation found no significant issues.  She has a history of H. pylori infection and is not currently taking omeprazole . She denies issues with bowel movements.  She denies smoking and reports no issues with depression or anxiety, although he sometimes feels anxious. She has seasonal allergies and mild congestion. No problems with sleep, diet, or exercise, though she acknowledges the need to improve her diet and exercise routine.   Last depression screening scores    07/08/2023   10:04 AM 01/24/2023    9:51 AM  PHQ 2/9 Scores  PHQ - 2 Score 0 0  PHQ- 9 Score 0 0      07/08/2023   10:04 AM 01/24/2023    9:56 AM  GAD 7 : Generalized Anxiety Score  Nervous, Anxious, on Edge 0 0  Control/stop worrying 0 0  Worry too much - different things 0 0  Trouble relaxing 0 0  Restless 0 0  Easily annoyed or irritable 0 0  Afraid - awful might happen 0 0  Total GAD 7 Score 0 0  Anxiety Difficulty Not difficult at all Not  difficult at all     Last fall risk screening     No data to display         Last Audit-C alcohol use screening    04/05/2023    9:30 AM  Alcohol Use Disorder Test (AUDIT)  1. How often do you have a drink containing alcohol? 0  3. How often do you have six or more drinks on one occasion? 0   A score of 3 or more in women, and 4 or more in men indicates increased risk for alcohol abuse, EXCEPT if all of the points are from question 1   Past Medical History:  Diagnosis Date   Anemia    Asthma    Herpes genitalis    Past Surgical History:  Procedure Laterality Date   DILATION AND CURETTAGE OF UTERUS N/A 10/22/2015   Procedure: DILATATION AND CURETTAGE;  Surgeon: Alben Alma, MD;  Location: [facility];  Service: Gynecology;  Laterality: N/A;   HYSTEROSCOPY WITH D & C N/A 01/04/2019   Procedure: DILATATION AND CURETTAGE /HYSTEROSCOPY, REMOVAL OF UTERINE MASS;  Surgeon: Kris Pester, MD;  Location: [facility];  Service: Gynecology;  Laterality: N/A;   Social History   Socioeconomic History   Marital status: Single    Spouse name: Not on file   Number of children: Not on file   Years  of education: Not on file   Highest education level: 12th grade  Occupational History   Not on file  Tobacco Use   Smoking status: Never   Smokeless tobacco: Never  Vaping Use   Vaping status: Never Used  Substance and Sexual Activity   Alcohol use: No   Drug use: No   Sexual activity: Not Currently    Birth control/protection: None  Other Topics Concern   Not on file  Social History Narrative   Not on file   Social Drivers of Health   Financial Resource Strain: Low Risk  (04/05/2023)   Overall Financial Resource Strain (CARDIA)    Difficulty of Paying Living Expenses: Not hard at all  Food Insecurity: No Food Insecurity (04/05/2023)   Hunger Vital Sign    Worried About Running Out of Food in the Last Year: Never true    Ran Out of Food in the Last Year: Never true   Transportation Needs: Not on file  Physical Activity: Unknown (04/05/2023)   Exercise Vital Sign    Days of Exercise per Week: 0 days    Minutes of Exercise per Session: Not on file  Stress: No Stress Concern Present (04/05/2023)   [employer] of Occupational Health - Occupational Stress Questionnaire    Feeling of Stress : Not at all  Social Connections: Unknown (04/05/2023)   Social Connection and Isolation Panel [NHANES]    Frequency of Communication with Friends and Family: More than three times a week    Frequency of Social Gatherings with Friends and Family: Twice a week    Attends Religious Services: 1 to 4 times per year    Active Member of [employer] or Organizations: No    Attends Engineer, structural: Not on file    Marital Status: Not on file  Intimate Partner Violence: Not on file   Family Status  Relation Name Status   Mother  Alive   Father  Alive   PGF  Alive  No partnership data on file   Family History  Problem Relation Age of Onset   Lupus Father    Diabetes Paternal Grandfather    Hypertension Paternal Grandfather    Allergies  Allergen Reactions   Penicillins Other (See Comments)    Unsure if she is allergic  Unknown reaction, pt believes she was told she was allergic to penicillin  Did it involve swelling of the face/tongue/throat, SOB, or low BP? Unknown  Did it involve sudden or severe rash/hives, skin peeling, or any reaction on the inside of your mouth or nose? Unknown  Did you need to seek medical attention at a hospital or doctor's office? Unknown  When did it last happen?        If all above answers are "NO", may proceed with cephalosporin use.  Unsure if she is allergic Unknown reaction, pt believes she was told she was allergic to penicillin Did it involve swelling of the face/tongue/throat, SOB, or low BP? Unknown Did it involve sudden or severe rash/hives, skin peeling, or any reaction on the inside of your mouth or nose?  Unknown Did you need to seek medical attention at a hospital or doctor's office? Unknown When did it last happen?       If all above answers are "NO", may proceed with cephalosporin use.  Unsure if she is allergic Unknown reaction, pt believes she was told she was allergic to penicillin Did it involve swelling of the face/tongue/throat, SOB, or low BP? Unknown Did  it involve sudden or severe rash/hives, skin peeling, or any reaction on the inside of your mouth or nose? Unknown Did you need to seek medical attention at a hospital or doctor's office? Unknown When did it last happen?       If all above answers are "NO", may proceed with cephalosporin use.    Unsure if she is allergic Unknown reaction, pt believes she was told she was allergic to penicillin Did it involve swelling of the face/tongue/throat, SOB, or low BP? Unknown Did it involve sudden or severe rash/hives, skin peeling, or any reaction on the inside of your mouth or nose? Unknown Did you need to seek medical attention at a hospital or doctor's office? Unknown When did it last happen?       If all above answers are "NO", may proceed with cephalosporin use.    Patient Care Team: Esther Broyles, PA-C as PCP - General (Physician Assistant)   Medications: Outpatient Medications Prior to Visit  Medication Sig   norelgestromin -ethinyl estradiol  (XULANE) 150-35 MCG/24HR transdermal patch Place 1 patch onto the skin once a week. Remain patch free on fourth week. (Patient not taking: Reported on 04/05/2023)   [DISCONTINUED] pantoprazole  (PROTONIX ) 40 MG tablet Take 1 tablet (40 mg total) by mouth 2 (two) times daily. (Patient not taking: Reported on 07/08/2023)   No facility-administered medications prior to visit.    Review of Systems  All other systems reviewed and are negative.  Except see HPI     Objective    BP (!) 93/58 (BP Location: Right Arm, Patient Position: Sitting, Cuff Size: Normal)   Pulse 76   Resp 16   Ht 5\' 4"  (1.626  m)   Wt 122 lb 6.4 oz (55.5 kg)   SpO2 99%   BMI 21.01 kg/m      Physical Exam Vitals reviewed.  Constitutional:      General: She is not in acute distress.    Appearance: Normal appearance. She is well-developed. She is not ill-appearing, toxic-appearing or diaphoretic.  HENT:     Head: Normocephalic and atraumatic.     Right Ear: Tympanic membrane, ear canal and external ear normal.     Left Ear: Tympanic membrane, ear canal and external ear normal.     Nose: Nose normal. No congestion or rhinorrhea.     Mouth/Throat:     Mouth: Mucous membranes are moist.     Pharynx: Oropharynx is clear. No oropharyngeal exudate.  Eyes:     General: No scleral icterus.       Right eye: No discharge.        Left eye: No discharge.     Conjunctiva/sclera: Conjunctivae normal.     Pupils: Pupils are equal, round, and reactive to light.  Neck:     Thyroid : No thyromegaly.     Vascular: No carotid bruit.  Cardiovascular:     Rate and Rhythm: Normal rate and regular rhythm.     Pulses: Normal pulses.     Heart sounds: Normal heart sounds. No murmur heard.    No friction rub. No gallop.  Pulmonary:     Effort: Pulmonary effort is normal. No respiratory distress.     Breath sounds: Normal breath sounds. No wheezing or rales.  Abdominal:     General: Abdomen is flat. Bowel sounds are normal. There is no distension.     Palpations: Abdomen is soft. There is no mass.     Tenderness: There is no abdominal tenderness. There is no  right CVA tenderness, left CVA tenderness, guarding or rebound.     Hernia: No hernia is present.  Musculoskeletal:        General: No swelling, tenderness, deformity or signs of injury. Normal range of motion.     Cervical back: Normal range of motion and neck supple. No rigidity or tenderness.     Right lower leg: No edema.     Left lower leg: No edema.  Lymphadenopathy:     Cervical: No cervical adenopathy.  Skin:    General: Skin is warm and dry.      Coloration: Skin is not jaundiced or pale.     Findings: No bruising, erythema, lesion or rash.  Neurological:     Mental Status: She is alert and oriented to person, place, and time. Mental status is at baseline.     Gait: Gait normal.  Psychiatric:        Mood and Affect: Mood normal.        Behavior: Behavior normal.        Thought Content: Thought content normal.        Judgment: Judgment normal.     Breast and pelvic exams declined/has an obgyn No results found for any visits on 07/08/23.  Assessment & Plan    Routine Health Maintenance and Physical Exam  Exercise Activities and Dietary recommendations  Goals   None     Immunization History  Administered Date(s) Administered   Tdap 07/31/2015, 03/30/2021    Health Maintenance  Topic Date Due   Pneumococcal Vaccination (1 of 2 - PCV) Never done   COVID-19 Vaccine (1 - 2024-25 season) Never done   Flu Shot  10/07/2023   Pap with HPV screening  07/12/2025   DTaP/Tdap/Td vaccine (3 - Td or Tdap) 03/31/2031   Hepatitis C Screening  Completed   HIV Screening  Completed   HPV Vaccine  Aged Out   Meningitis B Vaccine  Aged Out    Discussed health benefits of physical activity, and encouraged her to engage in regular exercise appropriate for her age and condition.  Assessment & Plan  Encounter for special screening examination for cardiovascular disorder - Lipid panel  Screening for metabolic disorder - Comprehensive metabolic panel  Screening for thyroid  disorder - TSH  Gastroesophageal reflux disease, unspecified whether esophagitis present - H. pylori breath test  H. pylori infection - pantoprazole  (PROTONIX ) 40 MG tablet; Take 1 tablet (40 mg total) by mouth daily.  Dispense: 90 tablet; Refill: 1  Asthma Asthma without exacerbation. Abnormal lung sounds possibly due to asthma or allergies. - Order chest x-ray to evaluate lung sounds.  Seasonal allergies Mild allergies with occasional congestion,  possibly affecting lung sounds.  GERD GERD with intermittent chest pain and palpitations, likely from acid reflux. Anxiety may contribute. - Order H. pylori test. - Continue omeprazole  post H. pylori test. - Avoid spicy foods. Will follow-up  H. pylori infection Need to confirm eradication to prevent recurrence. - Order H. pylori test to check eradication status.  Heart palpitations Intermittent palpitations, possibly diet or anxiety-related. Previous cardiology evaluation normal. - Monitor palpitations frequency, return if worsens. - Order TSH and electrolytes.  Annual physical exam UTD on dental/eye Things to do to keep yourself healthy  - Exercise at least 30-45 minutes a day, 3-4 days a week.  - Eat a low-fat diet with lots of fruits and vegetables, up to 7-9 servings per day.  - Seatbelts can save your life. Wear them always.  - Smoke  detectors on every level of your home, check batteries every year.  - Eye Doctor - have an eye exam every 1-2 years  - Safe sex - if you may be exposed to STDs, use a condom.  - Alcohol -  If you drink, do it moderately, less than 2 drinks per day.  - Health Care Power of Attorney. Choose someone to speak for you if you are not able.  - Depression is common in our stressful world.If you're feeling down or losing interest in things you normally enjoy, please come in for a visit.  - Violence - If anyone is threatening or hurting you, please call immediately. Wellness Visit Routine visit. Discussed diet, exercise, sleep. Encouraged healthy lifestyle. - Schedule physical exam in one year. - Schedule follow-up for chronic conditions in six months.    Return in about 1 year (around 07/07/2024) for CPE in a year, gerd/chronic in 6 mo.    The patient was advised to call back or seek an in-person evaluation if the symptoms worsen or if the condition fails to improve as anticipated.  I discussed the assessment and treatment plan with the patient. The  patient was provided an opportunity to ask questions and all were answered. The patient agreed with the plan and demonstrated an understanding of the instructions.  I, Tabetha Haraway, PA-C have reviewed all documentation for this visit. The documentation on 07/08/2023  for the exam, diagnosis, procedures, and orders are all accurate and complete.  Blane Bunting, [facility], MMS [facility] [phone] (phone) [phone] (fax)  [facility]

## 2023-07-09 LAB — COMPREHENSIVE METABOLIC PANEL WITH GFR
ALT: 11 IU/L (ref 0–32)
AST: 20 IU/L (ref 0–40)
Albumin: 4.2 g/dL (ref 3.9–4.9)
Alkaline Phosphatase: 76 IU/L (ref 44–121)
BUN/Creatinine Ratio: 9 (ref 9–23)
BUN: 8 mg/dL (ref 6–20)
Bilirubin Total: 0.6 mg/dL (ref 0.0–1.2)
CO2: 22 mmol/L (ref 20–29)
Calcium: 9.3 mg/dL (ref 8.7–10.2)
Chloride: 103 mmol/L (ref 96–106)
Creatinine, Ser: 0.85 mg/dL (ref 0.57–1.00)
Globulin, Total: 3.1 g/dL (ref 1.5–4.5)
Glucose: 86 mg/dL (ref 70–99)
Potassium: 4.2 mmol/L (ref 3.5–5.2)
Sodium: 139 mmol/L (ref 134–144)
Total Protein: 7.3 g/dL (ref 6.0–8.5)
eGFR: 90 mL/min/{1.73_m2} (ref >59)

## 2023-07-09 LAB — LIPID PANEL
Chol/HDL Ratio: 2.9 ratio (ref 0.0–4.4)
Cholesterol, Total: 177 mg/dL (ref 100–199)
HDL: 61 mg/dL (ref >39)
LDL Chol Calc (NIH): 106 mg/dL — ABNORMAL HIGH (ref 0–99)
Triglycerides: 49 mg/dL (ref 0–149)
VLDL Cholesterol Cal: 10 mg/dL (ref 5–40)

## 2023-07-09 LAB — TSH: TSH: 0.938 u[IU]/mL (ref 0.450–4.500)

## 2023-07-09 LAB — H. PYLORI BREATH TEST: H pylori Breath Test: POSITIVE — AB

## 2023-07-11 ENCOUNTER — Other Ambulatory Visit: Payer: Self-pay | Admitting: Physician Assistant

## 2023-07-11 ENCOUNTER — Encounter: Payer: Self-pay | Admitting: Physician Assistant

## 2023-07-11 ENCOUNTER — Encounter: Payer: Medicaid Other | Admitting: Physical Therapy

## 2023-07-11 DIAGNOSIS — B9681 Helicobacter pylori [H. pylori] as the cause of diseases classified elsewhere: Secondary | ICD-10-CM

## 2023-07-11 MED ORDER — CLARITHROMYCIN 500 MG PO TABS
500.0000 mg | ORAL_TABLET | Freq: Two times a day (BID) | ORAL | 0 refills | Status: AC
Start: 2023-07-11 — End: ?

## 2023-07-11 MED ORDER — METRONIDAZOLE 500 MG PO TABS
500.0000 mg | ORAL_TABLET | Freq: Two times a day (BID) | ORAL | 0 refills | Status: AC
Start: 2023-07-11 — End: 2023-07-25

## 2023-07-20 ENCOUNTER — Telehealth: Payer: Self-pay

## 2023-07-20 NOTE — Telephone Encounter (Signed)
 Lindsey Cain

## 2024-01-09 ENCOUNTER — Ambulatory Visit: Admitting: Physician Assistant

## 2024-07-10 ENCOUNTER — Encounter: Admitting: Physician Assistant
